# Patient Record
Sex: Male | Born: 1975 | Race: Black or African American | Hispanic: No | Smoking: Never smoker
Health system: Southern US, Community
[De-identification: ages and names within clinical notes are randomized; demographics above are authoritative.]

## PROBLEM LIST (undated history)

## (undated) DIAGNOSIS — K219 Gastro-esophageal reflux disease without esophagitis: Secondary | ICD-10-CM

## (undated) HISTORY — PX: NO PAST SURGERIES: SHX2092

## (undated) HISTORY — DX: Gastro-esophageal reflux disease without esophagitis: K21.9

---

## 2007-09-30 ENCOUNTER — Emergency Department (HOSPITAL_COMMUNITY): Admission: EM | Admit: 2007-09-30 | Discharge: 2007-09-30 | Payer: Self-pay | Admitting: Emergency Medicine

## 2009-03-28 ENCOUNTER — Ambulatory Visit: Payer: Self-pay | Admitting: Internal Medicine

## 2009-03-28 DIAGNOSIS — K219 Gastro-esophageal reflux disease without esophagitis: Secondary | ICD-10-CM

## 2009-05-30 ENCOUNTER — Ambulatory Visit: Payer: Self-pay | Admitting: Internal Medicine

## 2009-05-30 DIAGNOSIS — R1013 Epigastric pain: Secondary | ICD-10-CM

## 2009-05-30 LAB — CONVERTED CEMR LAB
ALT: 18 units/L (ref 0–53)
Basophils Relative: 0.2 % (ref 0.0–3.0)
Bilirubin Urine: NEGATIVE
Bilirubin, Direct: 0 mg/dL (ref 0.0–0.3)
Chloride: 105 meq/L (ref 96–112)
Eosinophils Relative: 1.3 % (ref 0.0–5.0)
HCT: 43.8 % (ref 39.0–52.0)
Hemoglobin, Urine: NEGATIVE
Hemoglobin: 14.7 g/dL (ref 13.0–17.0)
Lymphs Abs: 2 10*3/uL (ref 0.7–4.0)
MCV: 86.9 fL (ref 78.0–100.0)
Monocytes Absolute: 0.6 10*3/uL (ref 0.1–1.0)
Neutrophils Relative %: 71.5 % (ref 43.0–77.0)
Nitrite: NEGATIVE
Potassium: 3.7 meq/L (ref 3.5–5.1)
RBC: 5.03 M/uL (ref 4.22–5.81)
Sodium: 140 meq/L (ref 135–145)
Total Protein, Urine: NEGATIVE mg/dL
Total Protein: 7.8 g/dL (ref 6.0–8.3)
WBC: 9.5 10*3/uL (ref 4.5–10.5)

## 2009-10-01 ENCOUNTER — Telehealth: Payer: Self-pay | Admitting: Internal Medicine

## 2009-10-01 ENCOUNTER — Encounter: Payer: Self-pay | Admitting: Internal Medicine

## 2009-10-03 ENCOUNTER — Telehealth: Payer: Self-pay | Admitting: Internal Medicine

## 2010-10-22 NOTE — Progress Notes (Signed)
Summary: Pantoprzaole PA  Phone Note From Pharmacy   Caller: [facility] [phone] Call For: ID: [id number]  Summary of Call: Received PA request for Generic Protonix. I can try PA, but it will most likely be denied unless the patient has tried and failed both omeprazole and nexium. Please advise. Initial call taken by: Lucious Groves,  October 01, 2009 9:08 AM  Follow-up for Phone Call        try nexium, need a pharmacy Follow-up by: Etta Grandchild MD,  October 01, 2009 9:17 AM  Additional Follow-up for Phone Call Additional follow up Details #1::        sent prescription. Additional Follow-up by: Lucious Groves,  October 01, 2009 10:38 AM    New/Updated Medications: NEXIUM 40 MG CPDR (ESOMEPRAZOLE MAGNESIUM) One capsule by mouth once daily Prescriptions: NEXIUM 40 MG CPDR (ESOMEPRAZOLE MAGNESIUM) One capsule by mouth once daily  #30 x 11   Entered by:   Lucious Groves   Authorized by:   Etta Grandchild MD   Signed by:   Lucious Groves on 10/01/2009   Method used:   [facility]        [facility]  [id number]       [address]       [place], [place]  [address]       Ph: [phone] or [phone]       Fax: [phone]   RxID:   [id number] NEXIUM 40 MG CPDR (ESOMEPRAZOLE MAGNESIUM) One capsule by mouth once daily  #30 x 11   Entered and Authorized by:   Etta Grandchild MD   Signed by:   Etta Grandchild MD on 10/01/2009   Method used:   Historical   RxID:   [id number]   Appended Document: Pantoprzaole PA    Phone Note From Pharmacy   Summary of Call: Rec'd fax that nexium also requires PA. Sent in omeprazole for patient, insurance is apparently [place] make patient try omeprazole first. Initial call taken by: Lucious Groves,  October 01, 2009 12:55 PM  Follow-up for Phone Call        ok Follow-up by: Etta Grandchild MD,  October 01, 2009 1:10 PM    New/Updated Medications: OMEPRAZOLE 20 MG CPDR (OMEPRAZOLE) 1 by  mouth daily Prescriptions: OMEPRAZOLE 20 MG CPDR (OMEPRAZOLE) 1 by mouth daily  #30 x 11   Entered by:   Lucious Groves   Authorized by:   Etta Grandchild MD   Signed by:   Lucious Groves on 10/01/2009   Method used:   [facility]        [facility]  [address] [id number]       [address]       [place], [place]  [address]       Ph: [phone] or [phone]       Fax: [phone]   RxID:   [id number]

## 2010-10-22 NOTE — Progress Notes (Signed)
Summary: Omeprazole qty limit  Phone Note From Pharmacy   Call For: Case: [id number]  Summary of Call: Patient omeprazole requires PA due to qty limit only. Requested form. Initial call taken by: Lucious Groves,  October 01, 2009 1:28 PM  Follow-up for Phone Call        form awaiting signature. Follow-up by: Lucious Groves,  October 01, 2009 2:43 PM     Appended Document: Omeprazole qty limit form faxed.  Appended Document: Omeprazole qty limit Approved until 10/01/10

## 2010-10-22 NOTE — Progress Notes (Signed)
Summary: Temazepam request  Phone Note Refill Request Message from:  Fax from Pharmacy on October 03, 2009 10:52 AM  Temazepam 15mg  caps Sig: 1 by mouth at bedtime as needed #30 [facility] [phone] fax [phone]  Initial call taken by: Lucious Groves,  October 03, 2009 10:52 AM  Follow-up for Phone Call        ok Follow-up by: Etta Grandchild MD,  October 04, 2009 1:06 PM  Additional Follow-up for Phone Call Additional follow up Details #1::        phoned to pharmacy. Additional Follow-up by: Lucious Groves,  October 04, 2009 1:32 PM    New/Updated Medications: TEMAZEPAM 15 MG CAPS (TEMAZEPAM) 1 by mouth at bedtime as needed Prescriptions: TEMAZEPAM 15 MG CAPS (TEMAZEPAM) 1 by mouth at bedtime as needed  #30 x 3   Entered by:   Lucious Groves   Authorized by:   Etta Grandchild MD   Signed by:   Lucious Groves on 10/04/2009   Method used:   Telephoned to ...       [facility] ([facility]ail)       [address]       [place], [place]  [address]       Ph: [phone] or [phone]       Fax: [phone]   RxID:   [id number]

## 2010-10-22 NOTE — Medication Information (Signed)
Summary: Approved-Omeprazole/[employer]  Approved-Omeprazole/[employer]   Imported By: Sherian Rein 10/04/2009 11:34:39  _____________________________________________________________________  External Attachment:    Type:   Image     Comment:   External Document

## 2010-10-22 NOTE — Medication Information (Signed)
Summary: P Auth/Omeprazole/[employer]  P Auth/Omeprazole/[employer]   Imported By: Lester  Hills 10/03/2009 10:12:32  _____________________________________________________________________  External Attachment:    Type:   Image     Comment:   External Document

## 2012-04-01 ENCOUNTER — Encounter: Payer: Self-pay | Admitting: Gastroenterology

## 2012-04-13 ENCOUNTER — Encounter: Payer: Self-pay | Admitting: Gastroenterology

## 2012-04-13 ENCOUNTER — Ambulatory Visit (INDEPENDENT_AMBULATORY_CARE_PROVIDER_SITE_OTHER): Payer: BC Managed Care – PPO | Admitting: Gastroenterology

## 2012-04-13 VITALS — BP 140/90 | HR 64 | Ht 71.0 in | Wt 247.2 lb

## 2012-04-13 DIAGNOSIS — R1013 Epigastric pain: Secondary | ICD-10-CM

## 2012-04-13 MED ORDER — PANTOPRAZOLE SODIUM 40 MG PO TBEC: 40.0000 mg | DELAYED_RELEASE_TABLET | Freq: Every day | ORAL | Status: DC

## 2012-04-13 NOTE — Patient Instructions (Addendum)
You have been scheduled for an endoscopy with propofol. Please follow written instructions given to you at your visit today. If you use inhalers (even only as needed), please bring them with you on the day of your procedure.  We have sent the following medications to your pharmacy for you to pick up at your convenience:Pantoprazole.  cc: Sanda Linger, MD

## 2012-04-13 NOTE — Progress Notes (Signed)
History of Present Illness: This is a 36 year old male here today with his wife. He relates frequent problems with epigastric pain for the past several years. Occasionally his symptoms are improved by meals and occasionally worsened by meals. Symptoms have been inactive at times and other times they bothers him on a regular basis for months at that time. In 2010 he was treated for GERD with pantoprazole but he does not recall if this was effective in controlling symptoms. He is a Heritage manager at [employer] and has a very busy schedule over the next several months. Denies weight loss, constipation, diarrhea, change in stool caliber, melena, hematochezia, nausea, vomiting, dysphagia, reflux symptoms, chest pain.  Review of Systems: Pertinent positive and negative review of systems were noted in the above HPI section. All other review of systems were otherwise negative.  Current Medications, Allergies, Past Medical History, Past Surgical History, Family History and Social History were reviewed in [employer] record.  Physical Exam: General: Well developed , well nourished, no acute distress Head: Normocephalic and atraumatic Eyes:  sclerae anicteric, EOMI Ears: Normal auditory acuity Mouth: No deformity or lesions Neck: Supple, no masses or thyromegaly Lungs: Clear throughout to auscultation Heart: Regular rate and rhythm; no murmurs, rubs or bruits Abdomen: Soft,  mild tenderness over xiphoid process and epigastrium without rebound or guarding and non distended. No masses, hepatosplenomegaly or hernias noted. Normal Bowel sounds Musculoskeletal: Symmetrical with no gross deformities  Skin: No lesions on visible extremities Pulses:  Normal pulses noted Extremities: No clubbing, cyanosis, edema or deformities noted Neurological: Alert oriented x 4, grossly nonfocal Cervical Nodes:  No significant cervical adenopathy Inguinal Nodes: No significant inguinal  adenopathy Psychological:  Alert and cooperative. Normal mood and affect  Assessment and Recommendations:  1. Epigastric pain. Xiphoid area and epigastric tenderness. Rule out musculoskeletal pain, ulcer disease, GERD. Begin pantoprazole 40 mg daily and antireflux measures. Schedule upper endoscopy for further evaluation. The risks, benefits, and alternatives to endoscopy with possible biopsy and possible dilation were discussed with the patient and they consent to proceed. Consider abdominal ultrasound if upper endoscopy negative in symptoms not relieved with pantoprazole.

## 2012-04-20 ENCOUNTER — Encounter: Payer: BC Managed Care – PPO | Admitting: Gastroenterology

## 2012-04-28 ENCOUNTER — Ambulatory Visit: Payer: Self-pay | Admitting: Gastroenterology

## 2012-12-07 ENCOUNTER — Telehealth: Payer: Self-pay | Admitting: *Deleted

## 2012-12-07 NOTE — Telephone Encounter (Signed)
Via fax from [facility] patient needs prior authorization done for Pantoprazole.  I called [phone] and spoke with Tia.  Pantoprazole was approved, fax is being sent.  Case number is::: [id number]

## 2013-07-06 ENCOUNTER — Encounter (HOSPITAL_COMMUNITY): Payer: Self-pay | Admitting: Emergency Medicine

## 2013-07-06 ENCOUNTER — Emergency Department (HOSPITAL_COMMUNITY)
Admission: EM | Admit: 2013-07-06 | Discharge: 2013-07-06 | Disposition: A | Discharge status: Home / Self Care | Payer: BC Managed Care – PPO | Source: Home / Self Care

## 2013-07-06 DIAGNOSIS — S0181XA Laceration without foreign body of other part of head, initial encounter: Secondary | ICD-10-CM

## 2013-07-06 DIAGNOSIS — S0180XA Unspecified open wound of other part of head, initial encounter: Secondary | ICD-10-CM

## 2013-07-06 NOTE — ED Notes (Signed)
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

## 2013-07-06 NOTE — ED Provider Notes (Addendum)
CSN: [id number]     Arrival date & time 07/06/13  1936 History   None    Chief Complaint  Patient presents with  . Facial Laceration   (Consider location/radiation/quality/duration/timing/severity/associated sxs/prior Treatment) Patient is a 37 y.o. male presenting with skin laceration. The history is provided by the patient.  Laceration Location:  Face Facial laceration location:  Chin Length (cm):  4 Depth:  Cutaneous Quality: straight   Bleeding: controlled   Time since incident:  1 hour Laceration mechanism:  Blunt object (football helmet struck chin.) Pain details:    Severity:  Mild Foreign body present:  No foreign bodies Relieved by:  Nothing Worsened by:  Nothing tried Ineffective treatments:  None tried Tetanus status:  Up to date   Past Medical History  Diagnosis Date  . GERD (gastroesophageal reflux disease)    History reviewed. No pertinent past surgical history. Family History  Problem Relation Age of Onset  . Hypertension Mother    History  Substance Use Topics  . Smoking status: Never Smoker   . Smokeless tobacco: Never Used  . Alcohol Use: Yes    Review of Systems  Constitutional: Negative.   HENT: Negative for dental problem.   Musculoskeletal: Negative for neck pain.  Skin: Positive for wound.  Neurological: Negative for headaches.    Allergies  Review of patient's allergies indicates no known allergies.  Home Medications   Current Outpatient Rx  Name  Route  Sig  Dispense  Refill  . EXPIRED: pantoprazole (PROTONIX) 40 MG tablet   Oral   Take 1 tablet (40 mg total) by mouth daily.   30 tablet   11    There were no vitals taken for this visit. Physical Exam  Nursing note and vitals reviewed. Constitutional: He is oriented to person, place, and time. He appears well-developed and well-nourished.  HENT:  Head:    Neurological: He is alert and oriented to person, place, and time.  Skin: Skin is warm and dry.    ED Course    LACERATION REPAIR Date/Time: 07/06/2013 9:19 PM Performed by: Linna Hoff Authorized by: Bradd Canary D Consent: Verbal consent obtained. Risks and benefits: risks, benefits and alternatives were discussed Consent given by: patient Body area: head/neck Location details: chin Laceration length: 4 cm Foreign bodies: no foreign bodies Tendon involvement: none Nerve involvement: none Vascular damage: no Local anesthetic: lidocaine 2% with epinephrine Anesthetic total: 3 ml Patient sedated: no Preparation: Patient was prepped and draped in the usual sterile fashion. Irrigation solution: saline Amount of cleaning: standard Debridement: none Degree of undermining: none Skin closure: 5-0 Prolene Number of sutures: 8 Technique: running Approximation: close Approximation difficulty: simple Dressing: antibiotic ointment Patient tolerance: Patient tolerated the procedure well with no immediate complications.   (including critical care time) Labs Review Labs Reviewed - No data to display Imaging Review No results found.    MDM       Linna Hoff, MD 07/06/13 2120  Linna Hoff, MD 07/06/13 2122

## 2013-07-20 ENCOUNTER — Encounter (HOSPITAL_COMMUNITY): Payer: Self-pay | Admitting: Emergency Medicine

## 2013-07-20 ENCOUNTER — Emergency Department (INDEPENDENT_AMBULATORY_CARE_PROVIDER_SITE_OTHER)
Admission: EM | Admit: 2013-07-20 | Discharge: 2013-07-20 | Disposition: A | Discharge status: Home / Self Care | Payer: BC Managed Care – PPO | Source: Home / Self Care

## 2013-07-20 DIAGNOSIS — Z4802 Encounter for removal of sutures: Secondary | ICD-10-CM

## 2013-07-20 NOTE — ED Provider Notes (Signed)
CSN: [id number]     Arrival date & time 07/20/13  1959 History   None    Chief Complaint  Patient presents with  . Wound Check   (Consider location/radiation/quality/duration/timing/severity/associated sxs/prior Treatment) Patient is a 37 y.o. male presenting with wound check. The history is provided by the patient.  Wound Check This is a new problem. The current episode started more than 1 week ago (seen 10 /15 for lac on chin, here for suture removal., no problems.). The problem has been rapidly improving.    Past Medical History  Diagnosis Date  . GERD (gastroesophageal reflux disease)    History reviewed. No pertinent past surgical history. Family History  Problem Relation Age of Onset  . Hypertension Mother    History  Substance Use Topics  . Smoking status: Never Smoker   . Smokeless tobacco: Never Used  . Alcohol Use: Yes    Review of Systems  Constitutional: Negative.   Skin: Positive for wound.    Allergies  Review of patient's allergies indicates no known allergies.  Home Medications   Current Outpatient Rx  Name  Route  Sig  Dispense  Refill  . EXPIRED: pantoprazole (PROTONIX) 40 MG tablet   Oral   Take 1 tablet (40 mg total) by mouth daily.   30 tablet   11    BP 156/98  Pulse 62  Temp(Src) 98.4 F (36.9 C) (Oral)  Resp 17  SpO2 99% Physical Exam  Nursing note and vitals reviewed. Constitutional: He is oriented to person, place, and time. He appears well-developed and well-nourished.  Neurological: He is alert and oriented to person, place, and time.  Skin: Skin is warm and dry.  Chin lac well healed, sutures removed.    ED Course  Procedures (including critical care time) Labs Review Labs Reviewed - No data to display Imaging Review No results found.    MDM      Linna Hoff, MD 07/20/13 2034

## 2013-07-20 NOTE — ED Notes (Signed)
Pt. Stated, suture removal, no redness or swelling

## 2014-03-27 ENCOUNTER — Encounter (HOSPITAL_COMMUNITY): Payer: Self-pay | Admitting: Emergency Medicine

## 2014-03-27 ENCOUNTER — Emergency Department (INDEPENDENT_AMBULATORY_CARE_PROVIDER_SITE_OTHER): Payer: BC Managed Care – PPO

## 2014-03-27 ENCOUNTER — Emergency Department (HOSPITAL_COMMUNITY)
Admission: EM | Admit: 2014-03-27 | Discharge: 2014-03-27 | Disposition: A | Discharge status: Home / Self Care | Payer: BC Managed Care – PPO | Source: Home / Self Care | Attending: Emergency Medicine | Admitting: Emergency Medicine

## 2014-03-27 DIAGNOSIS — R509 Fever, unspecified: Secondary | ICD-10-CM

## 2014-03-27 LAB — CBC WITH DIFFERENTIAL/PLATELET
BASOS ABS: 0 10*3/uL (ref 0.0–0.1)
BASOS PCT: 0 % (ref 0–1)
EOS ABS: 0 10*3/uL (ref 0.0–0.7)
EOS PCT: 1 % (ref 0–5)
HEMATOCRIT: 43.3 % (ref 39.0–52.0)
Hemoglobin: 14.6 g/dL (ref 13.0–17.0)
Lymphocytes Relative: 14 % (ref 12–46)
Lymphs Abs: 0.5 10*3/uL — ABNORMAL LOW (ref 0.7–4.0)
MCH: 29 pg (ref 26.0–34.0)
MCHC: 33.7 g/dL (ref 30.0–36.0)
MCV: 85.9 fL (ref 78.0–100.0)
MONO ABS: 0.3 10*3/uL (ref 0.1–1.0)
Monocytes Relative: 9 % (ref 3–12)
Neutro Abs: 2.7 10*3/uL (ref 1.7–7.7)
Neutrophils Relative %: 76 % (ref 43–77)
PLATELETS: 153 10*3/uL (ref 150–400)
RBC: 5.04 MIL/uL (ref 4.22–5.81)
RDW: 13.5 % (ref 11.5–15.5)
WBC: 3.6 10*3/uL — ABNORMAL LOW (ref 4.0–10.5)

## 2014-03-27 LAB — POCT INFECTIOUS MONO SCREEN: Mono Screen: NEGATIVE

## 2014-03-27 LAB — COMPREHENSIVE METABOLIC PANEL
ALBUMIN: 4.1 g/dL (ref 3.5–5.2)
ALT: 31 U/L (ref 0–53)
AST: 29 U/L (ref 0–37)
Alkaline Phosphatase: 62 U/L (ref 39–117)
Anion gap: 16 — ABNORMAL HIGH (ref 5–15)
BUN: 12 mg/dL (ref 6–23)
CALCIUM: 8.8 mg/dL (ref 8.4–10.5)
CO2: 23 mEq/L (ref 19–32)
CREATININE: 1.61 mg/dL — AB (ref 0.50–1.35)
Chloride: 99 mEq/L (ref 96–112)
GFR calc Af Amer: 62 mL/min — ABNORMAL LOW (ref >90)
GFR, EST NON AFRICAN AMERICAN: 54 mL/min — AB (ref >90)
Glucose, Bld: 104 mg/dL — ABNORMAL HIGH (ref 70–99)
Potassium: 3.9 mEq/L (ref 3.7–5.3)
SODIUM: 138 meq/L (ref 137–147)
TOTAL PROTEIN: 8 g/dL (ref 6.0–8.3)
Total Bilirubin: 0.3 mg/dL (ref 0.3–1.2)

## 2014-03-27 LAB — POCT URINALYSIS DIP (DEVICE)
Glucose, UA: NEGATIVE mg/dL
LEUKOCYTES UA: NEGATIVE
NITRITE: NEGATIVE
PH: 5.5 (ref 5.0–8.0)
PROTEIN: 100 mg/dL — AB
Specific Gravity, Urine: 1.025 (ref 1.005–1.030)
Urobilinogen, UA: 0.2 mg/dL (ref 0.0–1.0)

## 2014-03-27 LAB — POCT RAPID STREP A: Streptococcus, Group A Screen (Direct): NEGATIVE

## 2014-03-27 MED ORDER — DOXYCYCLINE HYCLATE 100 MG PO TABS: 100.0000 mg | ORAL_TABLET | Freq: Two times a day (BID) | ORAL | Status: DC

## 2014-03-27 MED ORDER — HYDROCODONE-ACETAMINOPHEN 5-325 MG PO TABS: ORAL_TABLET | ORAL | Status: DC

## 2014-03-27 MED ORDER — ACETAMINOPHEN 325 MG PO TABS
650.0000 mg | ORAL_TABLET | Freq: Once | ORAL | Status: AC
  Administered 2014-03-27: 650 mg via ORAL

## 2014-03-27 MED ORDER — ACETAMINOPHEN 325 MG PO TABS
ORAL_TABLET | ORAL | Status: AC
  Filled 2014-03-27: qty 3

## 2014-03-27 NOTE — Discharge Instructions (Signed)

## 2014-03-27 NOTE — ED Notes (Signed)
C/o fever.  N/v.  Body aches.  Cold chills.  Rash on both arms without irritation.  Symptoms started Friday night.  Denies diarrhea.

## 2014-03-27 NOTE — ED Provider Notes (Signed)
Chief Complaint   Chief Complaint  Patient presents with  . Fever  . Rash    History of Present Illness   Terry [name] is a 38 year old male who has had a four-day history of chills, myalgias, subjective fever, and a nonpruritic on his arms, anorexia, nausea, vomiting, diarrhea, left ear pain, nasal congestion, rhinorrhea, and sore throat. He denies any headache, stiff neck, adenopathy, cough, chest pain, shortness of breath, abdominal pain, or blood in the stool. He's had no urinary symptoms. No history of a tick bite. No foreign travel or animal exposure. No sick exposures.  Review of Systems   Other than as noted above, the patient denies any of the following symptoms. Systemic:  No sweats, fatigue, myalgias, headache, weight loss or anorexia. Eye:  No redness or drainage. ENT:  No earache, nasal congestion, rhinorrhea, sinus pressure, or sore throat. No adenopathy or stiff neck. Lungs:  No cough, sputum production, wheezing, shortness of breath.  Cardiovascular:  No chest pain. GI:  No nausea, vomiting, abdominal pain or diarrhea. GU:  No dysuria, frequency, or hematuria. Skin:  No rash.  PMFSH   Past medical history, family history, social history, meds, and allergies were reviewed.   Physical Examination     Vital signs:  BP 125/75  Pulse 96  Temp(Src) 103.1 F (39.5 C) (Oral)  Resp 18  SpO2 97% General:  Alert, in no distress. Eye:  PERRL, full EOMs.  Lids and conjunctivas were normal. ENT:  TMs and canals were normal, without erythema or inflammation.  Nasal mucosa was clear and uncongested, without drainage.  Mucous membranes were moist.  Pharynx was clear, without exudate or drainage.  There were no oral ulcerations or lesions. Neck:  Supple, no adenopathy, tenderness or mass. Thyroid was normal. Lungs:  No respiratory distress.  Lungs were clear to auscultation, without wheezes, rales or rhonchi.  Breath sounds were clear and equal bilaterally. Heart:   Regular rhythm, without gallops, murmers or rubs. Abdomen:  Soft, flat, and non-tender to palpation.  No hepatosplenomagaly or mass. Extremities:  No swelling, erythema, or joint pain to palpation. Skin:  Clear, warm, and dry, there was an erythematous maculopapular rash on both volar forearms, skin was otherwise clear.      Labs   Results for orders placed during the hospital encounter of 03/27/14  CBC WITH DIFFERENTIAL      Result Value Ref Range   WBC 3.6 (*) 4.0 - 10.5 K/uL   RBC 5.04  4.22 - 5.81 MIL/uL   Hemoglobin 14.6  13.0 - 17.0 g/dL   HCT 95.643.3  21.339.0 - 08.652.0 %   MCV 85.9  78.0 - 100.0 fL   MCH 29.0  26.0 - 34.0 pg   MCHC 33.7  30.0 - 36.0 g/dL   RDW 57.813.5  46.911.5 - 62.915.5 %   Platelets 153  150 - 400 K/uL   Neutrophils Relative % 76  43 - 77 %   Neutro Abs 2.7  1.7 - 7.7 K/uL   Lymphocytes Relative 14  12 - 46 %   Lymphs Abs 0.5 (*) 0.7 - 4.0 K/uL   Monocytes Relative 9  3 - 12 %   Monocytes Absolute 0.3  0.1 - 1.0 K/uL   Eosinophils Relative 1  0 - 5 %   Eosinophils Absolute 0.0  0.0 - 0.7 K/uL   Basophils Relative 0  0 - 1 %   Basophils Absolute 0.0  0.0 - 0.1 K/uL  COMPREHENSIVE METABOLIC PANEL  Result Value Ref Range   Sodium 138  137 - 147 mEq/L   Potassium 3.9  3.7 - 5.3 mEq/L   Chloride 99  96 - 112 mEq/L   CO2 23  19 - 32 mEq/L   Glucose, Bld 104 (*) 70 - 99 mg/dL   BUN 12  6 - 23 mg/dL   Creatinine, Ser 1.61 (*) 0.50 - 1.35 mg/dL   Calcium 8.8  8.4 - 09.6 mg/dL   Total Protein 8.0  6.0 - 8.3 g/dL   Albumin 4.1  3.5 - 5.2 g/dL   AST 29  0 - 37 U/L   ALT 31  0 - 53 U/L   Alkaline Phosphatase 62  39 - 117 U/L   Total Bilirubin 0.3  0.3 - 1.2 mg/dL   GFR calc non Af Amer 54 (*) >90 mL/min   GFR calc Af Amer 62 (*) >90 mL/min   Anion gap 16 (*) 5 - 15  POCT RAPID STREP A ([facility]ARE ONLY)      Result Value Ref Range   Streptococcus, Group A Screen (Direct) NEGATIVE  NEGATIVE  POCT INFECTIOUS MONO SCREEN      Result Value Ref Range   Mono Screen  NEGATIVE  NEGATIVE  POCT URINALYSIS DIP (DEVICE)      Result Value Ref Range   Glucose, UA NEGATIVE  NEGATIVE mg/dL   Bilirubin Urine SMALL (*) NEGATIVE   Ketones, ur TRACE (*) NEGATIVE mg/dL   Specific Gravity, Urine 1.025  1.005 - 1.030   Hgb urine dipstick TRACE (*) NEGATIVE   pH 5.5  5.0 - 8.0   Protein, ur 100 (*) NEGATIVE mg/dL   Urobilinogen, UA 0.2  0.0 - 1.0 mg/dL   Nitrite NEGATIVE  NEGATIVE   Leukocytes, UA NEGATIVE  NEGATIVE     Radiology   Dg Chest 2 View  03/27/2014   CLINICAL DATA:  Cough, fever, congestion and rash.  EXAM: CHEST - 2 VIEW  COMPARISON:  09/30/2007  FINDINGS: The heart size and mediastinal contours are within normal limits. There is no evidence of pulmonary edema, consolidation, pneumothorax, nodule or pleural fluid. The visualized skeletal structures are unremarkable.  IMPRESSION: No active disease.   Electronically Signed   By: Irish Lack M.D.   On: 03/27/2014 14:20   Assessment   The encounter diagnosis was FUO (fever of unknown origin).  Differential diagnosis includes [facility] spotted fever, viral syndrome, strep throat, or infectious mononucleosis.  Plan   1.  Meds:  The following meds were prescribed:   Discharge Medication List as of 03/27/2014  3:35 PM    START taking these medications   Details  doxycycline (VIBRA-TABS) 100 MG tablet Take 1 tablet (100 mg total) by mouth 2 (two) times daily., Starting 03/27/2014, Until Discontinued, Normal    HYDROcodone-acetaminophen (NORCO/VICODIN) 5-325 MG per tablet 1 to 2 tabs every 4 to 6 hours as needed for pain., Print        2.  Patient Education/Counseling:  The patient was given appropriate handouts, self care instructions, and instructed in symptomatic relief.  May use Tylenol or ibuprofen for fever.   3.  Follow up:  The patient was told to follow up here in 2 to 3 days, or sooner if becoming worse in any way, and given some red flag symptoms such as increasing fever, severe headache  or stiff neck, difficulty breathing, chest pain, abdominal pain, or persistent vomiting which would prompt immediate return.  Follow up here as  necessary.     Reuben Likesavid C Nakeem Murnane, MD 03/27/14 2223

## 2014-03-28 LAB — URINE CULTURE
COLONY COUNT: NO GROWTH
Culture: NO GROWTH

## 2014-03-28 LAB — HIV ANTIBODY (ROUTINE TESTING W REFLEX): HIV: NONREACTIVE

## 2014-03-28 LAB — ROCKY MTN SPOTTED FVR AB, IGM-BLOOD: RMSF IGM: 0.25 IV (ref 0.00–0.89)

## 2014-03-28 LAB — RPR

## 2014-03-28 NOTE — Progress Notes (Signed)
Quick Note:  Result is abnormal as noted. We will inform patient about abnormal result. The patient was called and informed of the normal results and the one abnormal result of elevated creatinine. He was advised to make sure his blood pressure was impeccably controlled with a systolic target of less than 140 and diastolic target of less than 90. I also recommended that he get his cholesterol checked, drink extra fluids, and avoid kidney toxins such as nonsteroidal anti-inflammatory drugs and IV contrast. The patient states he's feeling a lot better today. His rash has not gotten any worse. Temperature is down to 99. He's feeling a lot better. I told him as long as his was feeling okay he did not need to return tomorrow. I suggest he does finish up the doxycycline. ______

## 2014-03-29 LAB — CULTURE, GROUP A STREP

## 2014-06-05 ENCOUNTER — Telehealth: Payer: Self-pay | Admitting: Gastroenterology

## 2014-06-05 NOTE — Telephone Encounter (Signed)
Spoke with the patient. He reports symptoms of nausea and his stomach hurts. Admits he has been under a lot of pressure and it could be stress related. He has been off Pantoprazole for a "long time." Appointment set for the patient to be evaluated.

## 2014-06-08 ENCOUNTER — Ambulatory Visit (INDEPENDENT_AMBULATORY_CARE_PROVIDER_SITE_OTHER): Payer: BC Managed Care – PPO | Admitting: Nurse Practitioner

## 2014-06-08 ENCOUNTER — Other Ambulatory Visit (INDEPENDENT_AMBULATORY_CARE_PROVIDER_SITE_OTHER): Payer: BC Managed Care – PPO

## 2014-06-08 ENCOUNTER — Encounter: Payer: Self-pay | Admitting: Nurse Practitioner

## 2014-06-08 VITALS — BP 130/98 | HR 72 | Ht 70.0 in | Wt 237.1 lb

## 2014-06-08 DIAGNOSIS — R1013 Epigastric pain: Secondary | ICD-10-CM

## 2014-06-08 LAB — CBC WITH DIFFERENTIAL/PLATELET
BASOS ABS: 0 10*3/uL (ref 0.0–0.1)
Basophils Relative: 0.4 % (ref 0.0–3.0)
Eosinophils Absolute: 0.2 10*3/uL (ref 0.0–0.7)
Eosinophils Relative: 3.3 % (ref 0.0–5.0)
HCT: 40.5 % (ref 39.0–52.0)
HEMOGLOBIN: 13.5 g/dL (ref 13.0–17.0)
LYMPHS PCT: 34 % (ref 12.0–46.0)
Lymphs Abs: 2.2 10*3/uL (ref 0.7–4.0)
MCHC: 33.2 g/dL (ref 30.0–36.0)
MCV: 86.2 fl (ref 78.0–100.0)
MONOS PCT: 5 % (ref 3.0–12.0)
Monocytes Absolute: 0.3 10*3/uL (ref 0.1–1.0)
Neutro Abs: 3.8 10*3/uL (ref 1.4–7.7)
Neutrophils Relative %: 57.3 % (ref 43.0–77.0)
PLATELETS: 288 10*3/uL (ref 150.0–400.0)
RBC: 4.7 Mil/uL (ref 4.22–5.81)
RDW: 13.9 % (ref 11.5–15.5)
WBC: 6.6 10*3/uL (ref 4.0–10.5)

## 2014-06-08 MED ORDER — PANTOPRAZOLE SODIUM 40 MG PO TBEC: DELAYED_RELEASE_TABLET | ORAL | Status: DC

## 2014-06-08 NOTE — Patient Instructions (Signed)
Please go to the basement level to have your labs drawn.   We sent a prescription for 30 days of Pantoprazole Sodium 40 mg ( Protonix),.  Take 1 tab 30 min before breakfast for 20 days, then take 1 tab every other day until finished.  Call us if you don't get better or symptoms come back.  [phone]

## 2014-06-09 NOTE — Progress Notes (Signed)
Reviewed and agree with management plan.  Ivis Henneman T. Aerielle Stoklosa, MD FACG 

## 2014-06-09 NOTE — Progress Notes (Signed)
     History of Present Illness:   Patient is a 38 year old male evaluated by [place] in July 2013 for epigastric pain. He is back today for evaluation of same. Patient took a PPI for a month or so following his last visit here and the epigastric pain resolved. He did well until a few weeks ago. Now with almost daily epigastric burning radiating through to back. He has mild nausea at times. Patient wonders if pain may be stress related though it bothers him at home, not just work. Burning has disrupted his sleep. Patient rarely takes NSAIDs. No overt GI bleeding. No black stools. BMs normal. Weight is stable.   Current Medications, Allergies, Past Medical History, Past Surgical History, Family History and Social History were reviewed in [employer] record.  Physical Exam: General: Pleasant, well developed , black male in no acute distress Head: Normocephalic and atraumatic Eyes:  sclerae anicteric, conjunctiva pink  Ears: Normal auditory acuity Lungs: Clear throughout to auscultation Heart: Regular rate and rhythm Abdomen: Soft, non distended, mild epigastric tenderness. No masses, no hepatomegaly. Normal bowel sounds Musculoskeletal: Symmetrical with no gross deformities  Extremities: No edema  Neurological: Alert oriented x 4, grossly nonfocal Psychological:  Alert and cooperative. Normal mood and affect  Assessment and Recommendations:   38 year old male with acute on chronic epigastric burning / occasional nausea. Patient thinks symptoms could be stress related. Suspect non-ulcer dyspepsia. These same symptoms resolved on PPI a couple of years ago. Patient has no alarming symptoms. It is reasonable to retreat with a PPI . Advised Protonix  daily for 20 days followed by one QOD until 30 day supple complete. If symptoms don't improve paitent will need EGD for further evaluation.

## 2015-01-10 ENCOUNTER — Ambulatory Visit (INDEPENDENT_AMBULATORY_CARE_PROVIDER_SITE_OTHER): Payer: BC Managed Care – PPO | Admitting: Internal Medicine

## 2015-01-10 ENCOUNTER — Encounter: Payer: Self-pay | Admitting: Internal Medicine

## 2015-01-10 VITALS — BP 120/80 | HR 65 | Temp 97.8°F | Resp 16 | Ht 70.0 in | Wt 246.0 lb

## 2015-01-10 DIAGNOSIS — T798XXA Other early complications of trauma, initial encounter: Secondary | ICD-10-CM

## 2015-01-10 DIAGNOSIS — L089 Local infection of the skin and subcutaneous tissue, unspecified: Secondary | ICD-10-CM | POA: Insufficient documentation

## 2015-01-10 DIAGNOSIS — T148XXA Other injury of unspecified body region, initial encounter: Secondary | ICD-10-CM

## 2015-01-10 MED ORDER — DOXYCYCLINE HYCLATE 100 MG PO TBEC: 100.0000 mg | DELAYED_RELEASE_TABLET | Freq: Two times a day (BID) | ORAL | Status: AC

## 2015-01-10 NOTE — Progress Notes (Signed)
Pre visit review using our clinic review tool, if applicable. No additional management support is needed unless otherwise documented below in the visit note. 

## 2015-01-10 NOTE — Patient Instructions (Signed)
Wound Infection °A wound infection happens when a type of germ (bacteria) starts growing in the wound. In some cases, this can cause the wound to break open. If cared for properly, the infected wound will heal from the inside to the outside. Wound infections need treatment. °CAUSES °An infection is caused by bacteria growing in the wound.  °SYMPTOMS  °· Increase in redness, swelling, or pain at the wound site. °· Increase in drainage at the wound site. °· Wound or bandage (dressing) starts to smell bad. °· Fever. °· Feeling tired or fatigued. °· Pus draining from the wound. °TREATMENT  °Your health care provider will prescribe antibiotic medicine. The wound infection should improve within 24 to 48 hours. Any redness around the wound should stop spreading and the wound should be less painful.  °HOME CARE INSTRUCTIONS  °· Only take over-the-counter or prescription medicines for pain, discomfort, or fever as directed by your health care provider. °· Take your antibiotics as directed. Finish them even if you start to feel better. °· Gently wash the area with mild soap and water 2 times a day, or as directed. Rinse off the soap. Pat the area dry with a clean towel. Do not rub the wound. This may cause bleeding. °· Follow your health care provider's instructions for how often you need to change the dressing. °· Apply ointment and a dressing to the wound as directed. °· If the dressing sticks, moisten it with soapy water and gently remove it. °· Change the bandage right away if it becomes wet, dirty, or develops a bad smell. °· Take showers. Do not take tub baths, swim, or do anything that may soak the wound until it is healed. °· Avoid exercises that make you sweat heavily. °· Use anti-itch medicine as directed by your health care provider. The wound may itch when it is healing. Do not pick or scratch at the wound. °· Follow up with your health care provider to get your wound rechecked as directed. °SEEK MEDICAL CARE  IF: °· You have an increase in swelling, pain, or redness around the wound. °· You have an increase in the amount of pus coming from the wound. °· There is a bad smell coming from the wound. °· More of the wound breaks open. °· You have a fever. °MAKE SURE YOU:  °· Understand these instructions. °· Will watch your condition. °· Will get help right away if you are not doing well or get worse. °Document Released: 06/07/2003 Document Revised: 09/13/2013 Document Reviewed: 01/12/2011 °[employer]® Patient Information ©2015 [employer]. This information is not intended to replace advice given to you by your health care provider. Make sure you discuss any questions you have with your health care provider. ° °

## 2015-01-10 NOTE — Assessment & Plan Note (Signed)
Will treat with doxycycline as there may be a mild staph or strep infection He was given pt ed material about wound care

## 2015-01-10 NOTE — Progress Notes (Signed)
   Subjective:    Patient ID: Terry [name], male    DOB: [birth year], [age] y.o.   MRN: [id number]  HPI Comments: He was trimming his pubic hair about one week ago and cut the right shaft of his penis and came in today for a wound check - he has put OTC topical antibiotic on it.  Wound Check He was originally treated 5 to 10 days ago. Previous treatment included wound cleansing or irrigation. There has been no drainage from the wound. There is no redness present. There is no swelling present. The pain has improved. He has no difficulty moving the affected extremity or digit.      Review of Systems  Constitutional: Negative.  Negative for fever, chills, diaphoresis, appetite change and fatigue.  HENT: Negative.   Eyes: Negative.   Respiratory: Negative.   Cardiovascular: Negative.  Negative for chest pain, palpitations and leg swelling.  Gastrointestinal: Negative.   Endocrine: Negative.   Genitourinary: Negative.  Negative for difficulty urinating.  Musculoskeletal: Negative.   Skin: Positive for wound. Negative for rash.  Allergic/Immunologic: Negative.   Hematological: Negative.  Negative for adenopathy. Does not bruise/bleed easily.  Psychiatric/Behavioral: Negative.        Objective:   Physical Exam  Constitutional: He is oriented to person, place, and time. He appears well-developed. No distress.  HENT:  Mouth/Throat: No oropharyngeal exudate.  Eyes: Right eye exhibits no discharge. Left eye exhibits no discharge. No scleral icterus.  Neck: Normal range of motion. Neck supple. No JVD present. No tracheal deviation present. No thyromegaly present.  Cardiovascular: Normal rate, regular rhythm, normal heart sounds and intact distal pulses.  Exam reveals no gallop and no friction rub.   No murmur heard. Pulmonary/Chest: Effort normal and breath sounds normal. No stridor. No respiratory distress. He has no wheezes. He has no rales. He exhibits no tenderness.  Abdominal: Soft.  Bowel sounds are normal. He exhibits no distension and no mass. There is no tenderness. There is no rebound and no guarding. Hernia confirmed negative in the right inguinal area and confirmed negative in the left inguinal area.  Genitourinary: Testes normal.    Right testis shows no mass, no swelling and no tenderness. Right testis is descended. Left testis shows no mass, no swelling and no tenderness. Left testis is descended. Circumcised. Penile erythema present. No penile tenderness. No discharge found.  Musculoskeletal: Normal range of motion. He exhibits no edema or tenderness.  Lymphadenopathy:    He has no cervical adenopathy.       Right: No inguinal adenopathy present.       Left: No inguinal adenopathy present.  Neurological: He is oriented to person, place, and time.  Skin: Skin is warm and dry. No rash noted. He is not diaphoretic. No erythema. No pallor.  Vitals reviewed.         Assessment & Plan:

## 2018-02-11 ENCOUNTER — Encounter: Payer: Self-pay | Admitting: Internal Medicine

## 2018-02-11 ENCOUNTER — Other Ambulatory Visit (INDEPENDENT_AMBULATORY_CARE_PROVIDER_SITE_OTHER): Payer: BC Managed Care – PPO

## 2018-02-11 ENCOUNTER — Ambulatory Visit (INDEPENDENT_AMBULATORY_CARE_PROVIDER_SITE_OTHER): Payer: BC Managed Care – PPO | Admitting: Internal Medicine

## 2018-02-11 VITALS — BP 180/100 | HR 65 | Temp 97.9°F | Resp 16 | Ht 71.0 in | Wt 257.0 lb

## 2018-02-11 DIAGNOSIS — I10 Essential (primary) hypertension: Secondary | ICD-10-CM | POA: Diagnosis not present

## 2018-02-11 DIAGNOSIS — Z0000 Encounter for general adult medical examination without abnormal findings: Secondary | ICD-10-CM | POA: Diagnosis not present

## 2018-02-11 DIAGNOSIS — E785 Hyperlipidemia, unspecified: Secondary | ICD-10-CM | POA: Diagnosis not present

## 2018-02-11 DIAGNOSIS — E559 Vitamin D deficiency, unspecified: Secondary | ICD-10-CM | POA: Diagnosis not present

## 2018-02-11 LAB — LIPID PANEL
CHOL/HDL RATIO: 5
Cholesterol: 201 mg/dL — ABNORMAL HIGH (ref 0–200)
HDL: 39.1 mg/dL (ref >39.00)
LDL CALC: 142 mg/dL — AB (ref 0–99)
NONHDL: 161.83
TRIGLYCERIDES: 101 mg/dL (ref 0.0–149.0)
VLDL: 20.2 mg/dL (ref 0.0–40.0)

## 2018-02-11 LAB — URINALYSIS, ROUTINE W REFLEX MICROSCOPIC
Bilirubin Urine: NEGATIVE
Ketones, ur: NEGATIVE
Leukocytes, UA: NEGATIVE
Nitrite: NEGATIVE
PH: 6 (ref 5.0–8.0)
Specific Gravity, Urine: >=1.03 — AB (ref 1.000–1.030)
TOTAL PROTEIN, URINE-UPE24: NEGATIVE
UROBILINOGEN UA: 0.2 (ref 0.0–1.0)
Urine Glucose: NEGATIVE

## 2018-02-11 LAB — COMPREHENSIVE METABOLIC PANEL
ALT: 17 U/L (ref 0–53)
AST: 17 U/L (ref 0–37)
Albumin: 4.5 g/dL (ref 3.5–5.2)
Alkaline Phosphatase: 85 U/L (ref 39–117)
BUN: 14 mg/dL (ref 6–23)
CO2: 28 meq/L (ref 19–32)
Calcium: 9.4 mg/dL (ref 8.4–10.5)
Chloride: 104 mEq/L (ref 96–112)
Creatinine, Ser: 1.24 mg/dL (ref 0.40–1.50)
GFR: 82.99 mL/min (ref >60.00)
GLUCOSE: 97 mg/dL (ref 70–99)
POTASSIUM: 3.9 meq/L (ref 3.5–5.1)
SODIUM: 139 meq/L (ref 135–145)
Total Bilirubin: 0.5 mg/dL (ref 0.2–1.2)
Total Protein: 7.7 g/dL (ref 6.0–8.3)

## 2018-02-11 LAB — CBC WITH DIFFERENTIAL/PLATELET
BASOS ABS: 0.1 10*3/uL (ref 0.0–0.1)
Basophils Relative: 0.7 % (ref 0.0–3.0)
Eosinophils Absolute: 0.2 10*3/uL (ref 0.0–0.7)
Eosinophils Relative: 2.2 % (ref 0.0–5.0)
HCT: 41.7 % (ref 39.0–52.0)
Hemoglobin: 13.9 g/dL (ref 13.0–17.0)
Lymphocytes Relative: 41 % (ref 12.0–46.0)
Lymphs Abs: 3.4 10*3/uL (ref 0.7–4.0)
MCHC: 33.3 g/dL (ref 30.0–36.0)
MCV: 84.8 fl (ref 78.0–100.0)
MONO ABS: 0.4 10*3/uL (ref 0.1–1.0)
MONOS PCT: 5.1 % (ref 3.0–12.0)
NEUTROS ABS: 4.2 10*3/uL (ref 1.4–7.7)
NEUTROS PCT: 51 % (ref 43.0–77.0)
PLATELETS: 257 10*3/uL (ref 150.0–400.0)
RBC: 4.92 Mil/uL (ref 4.22–5.81)
RDW: 13.9 % (ref 11.5–15.5)
WBC: 8.2 10*3/uL (ref 4.0–10.5)

## 2018-02-11 LAB — VITAMIN D 25 HYDROXY (VIT D DEFICIENCY, FRACTURES): VITD: 15.24 ng/mL — AB (ref 30.00–100.00)

## 2018-02-11 LAB — PSA: PSA: 0.52 ng/mL (ref 0.10–4.00)

## 2018-02-11 MED ORDER — AZILSARTAN-CHLORTHALIDONE 40-25 MG PO TABS: 1.0000 | ORAL_TABLET | Freq: Every day | ORAL | 0 refills | Status: DC

## 2018-02-11 MED ORDER — CHOLECALCIFEROL 50 MCG (2000 UT) PO TABS: 2.0000 | ORAL_TABLET | Freq: Every day | ORAL | 1 refills | Status: AC

## 2018-02-11 NOTE — Patient Instructions (Signed)

## 2018-02-11 NOTE — Progress Notes (Signed)
Subjective:  Patient ID: Terry [name], male    DOB: 30-May-1976  Age: 42 y.o. MRN: [id number]  CC: Annual Exam and Hypertension   HPI Terry [name] presents for a CPX.  He complains of a few headaches and wt gain recently.  He is taking a decongestant for nasal congestion.  He denies any recent episodes of CP, DOE, palpitations, edema, or fatigue.  He has a strong family history of hypertension.  Outpatient Medications Prior to Visit  Medication Sig Dispense Refill  . pantoprazole (PROTONIX) 40 MG tablet Take 1 tab 30 min before breakfast. Take for 20 days, then take 1 tab every other day until finished. (Patient not taking: Reported on 02/11/2018) 30 tablet 0   No facility-administered medications prior to visit.     ROS Review of Systems  Constitutional: Positive for unexpected weight change. Negative for appetite change, diaphoresis and fatigue.  HENT: Positive for congestion. Negative for nosebleeds, postnasal drip, rhinorrhea, sinus pressure and sore throat.   Eyes: Negative for pain and visual disturbance.  Respiratory: Negative for apnea, cough, chest tightness, shortness of breath and wheezing.   Cardiovascular: Negative for chest pain, palpitations and leg swelling.  Gastrointestinal: Negative for abdominal pain, constipation, diarrhea, nausea and vomiting.  Endocrine: Negative.   Genitourinary: Negative for difficulty urinating, dysuria, penile swelling, scrotal swelling, testicular pain and urgency.  Musculoskeletal: Negative.  Negative for back pain and myalgias.  Skin: Negative.  Negative for color change, pallor and rash.  Neurological: Positive for headaches. Negative for dizziness, tremors, weakness, light-headedness and numbness.  Hematological: Negative for adenopathy. Does not bruise/bleed easily.  Psychiatric/Behavioral: Negative.     Objective:  BP (!) 180/100 (BP Location: Left Arm, Patient Position: Sitting, Cuff Size: Large) Comment: BP (R)  180/100 (L) 182/106  Pulse 65   Temp 97.9 F (36.6 C) (Oral)   Resp 16   Ht  (1.803 m)   Wt 257 lb (116.6 kg)   SpO2 96%   BMI 35.84 kg/m   BP Readings from Last 3 Encounters:  02/11/18 (!) 180/100  01/10/15 120/80  06/08/14 (!) 130/98    Wt Readings from Last 3 Encounters:  02/11/18 257 lb (116.6 kg)  01/10/15 246 lb (111.6 kg)  06/08/14 237 lb 2 oz (107.6 kg)    Physical Exam  Constitutional: He is oriented to person, place, and time. No distress.  HENT:  Mouth/Throat: Oropharynx is clear and moist. No oropharyngeal exudate.  Eyes: Pupils are equal, round, and reactive to light. Conjunctivae and EOM are normal.  Neck: Normal range of motion. Neck supple. No thyromegaly present.  Cardiovascular: Normal rate, regular rhythm, normal heart sounds and intact distal pulses.  No murmur heard. EKG ----   Sinus  Rhythm  -First degree A-V block  PRi = 246 -consider old anterior infarct.   ABNORMAL - no old EKG for comparison, anterior changes are not sginificant   Pulmonary/Chest: Effort normal and breath sounds normal. He has no wheezes. He has no rales.  Abdominal: Soft. Bowel sounds are normal. He exhibits no mass. There is no hepatosplenomegaly. There is no tenderness. Hernia confirmed negative in the right inguinal area and confirmed negative in the left inguinal area.  Genitourinary: Rectum normal, prostate normal, testes normal and penis normal. Rectal exam shows no external hemorrhoid, no internal hemorrhoid, no fissure, no mass, no tenderness, anal tone normal and guaiac negative stool. Prostate is not enlarged and not tender. Right testis shows no mass, no swelling and no tenderness. Left testis  shows no mass, no swelling and no tenderness. Circumcised. No penile erythema or penile tenderness. No discharge found.  Musculoskeletal: Normal range of motion. He exhibits no edema, tenderness or deformity.  Lymphadenopathy:    He has no cervical adenopathy. No inguinal  adenopathy noted on the right or left side.  Neurological: He is alert and oriented to person, place, and time.  Skin: Skin is warm and dry. No rash noted. He is not diaphoretic.  Psychiatric: He has a normal mood and affect. His behavior is normal. Judgment and thought content normal.  Vitals reviewed.   Lab Results  Component Value Date   WBC 8.2 02/11/2018   HGB 13.9 02/11/2018   HCT 41.7 02/11/2018   PLT 257.0 02/11/2018   GLUCOSE 97 02/11/2018   CHOL 201 (H) 02/11/2018   TRIG 101.0 02/11/2018   HDL 39.10 02/11/2018   LDLCALC 142 (H) 02/11/2018   ALT 17 02/11/2018   AST 17 02/11/2018   NA 139 02/11/2018   K 3.9 02/11/2018   CL 104 02/11/2018   CREATININE 1.24 02/11/2018   BUN 14 02/11/2018   CO2 28 02/11/2018   TSH 1.88 02/11/2018   PSA 0.52 02/11/2018    Dg Chest 2 View  Result Date: 03/27/2014 CLINICAL DATA:  Cough, fever, congestion and rash. EXAM: CHEST - 2 VIEW COMPARISON:  09/30/2007 FINDINGS: The heart size and mediastinal contours are within normal limits. There is no evidence of pulmonary edema, consolidation, pneumothorax, nodule or pleural fluid. The visualized skeletal structures are unremarkable. IMPRESSION: No active disease. Electronically Signed   By: Irish Lack M.D.   On: 03/27/2014 14:20    Assessment & Plan:   Raylon was seen today for annual exam and hypertension.  Diagnoses and all orders for this visit:  Routine general medical examination at a health care facility- Exam completed, labs reviewed, vaccines reviewed, patient education material was given. -     Comprehensive metabolic panel; Future -     CBC with Differential/Platelet; Future -     Thyroid Panel With TSH; Future -     Urinalysis, Routine w reflex microscopic; Future -     VITAMIN D 25 Hydroxy (Vit-D Deficiency, Fractures); Future  Essential hypertension- He has new onset, stage II hypertension.  I have asked him to stop using decongestants.  I will treat the vitamin D  deficiency.  His EKG is negative for LVH.  The rest of his labs are negative for secondary causes or endorgan damage.  I will start treating this with an ARB and a thiazide diuretic. -     Lipid panel; Future -     PSA; Future -     EKG 12-Lead -     Azilsartan-Chlorthalidone (EDARBYCLOR) 40-25 MG TABS; Take 1 tablet by mouth daily.  Vitamin D deficiency -     Cholecalciferol 2000 units TABS; Take 2 tablets (4,000 Units total) by mouth daily.  Hyperlipidemia LDL goal <160- He does not have an elevated ASCVD risk score and he has achieved his LDL goal so I do not think statin and aspirin therapy is indicated.   I have discontinued Tashawn Erazo's pantoprazole. I am also having him start on Azilsartan-Chlorthalidone and Cholecalciferol.  Meds ordered this encounter  Medications  . Azilsartan-Chlorthalidone (EDARBYCLOR) 40-25 MG TABS    Sig: Take 1 tablet by mouth daily.    Dispense:  28 tablet    Refill:  0  . Cholecalciferol 2000 units TABS    Sig: Take 2 tablets (4,000  Units total) by mouth daily.    Dispense:  180 tablet    Refill:  1     Follow-up: Return in about 1 month (around 03/11/2018).  Sanda Linger, MD

## 2018-02-12 DIAGNOSIS — E785 Hyperlipidemia, unspecified: Secondary | ICD-10-CM | POA: Insufficient documentation

## 2018-02-12 LAB — THYROID PANEL WITH TSH
FREE THYROXINE INDEX: 2.7 (ref 1.4–3.8)
T3 Uptake: 35 % (ref 22–35)
T4, Total: 7.8 ug/dL (ref 4.9–10.5)
TSH: 1.88 m[IU]/L (ref 0.40–4.50)

## 2018-03-15 ENCOUNTER — Ambulatory Visit: Payer: BC Managed Care – PPO | Admitting: Internal Medicine

## 2018-03-15 ENCOUNTER — Encounter: Payer: Self-pay | Admitting: Internal Medicine

## 2018-03-15 DIAGNOSIS — I10 Essential (primary) hypertension: Secondary | ICD-10-CM

## 2018-03-15 MED ORDER — NEBIVOLOL HCL 5 MG PO TABS: 5.0000 mg | ORAL_TABLET | Freq: Every day | ORAL | 0 refills | Status: DC

## 2018-03-15 MED ORDER — AZILSARTAN-CHLORTHALIDONE 40-25 MG PO TABS
1.0000 | ORAL_TABLET | Freq: Every day | ORAL | 0 refills | Status: DC
Start: 2018-03-15 — End: 2018-03-30

## 2018-03-15 NOTE — Patient Instructions (Signed)

## 2018-03-15 NOTE — Progress Notes (Signed)
Subjective:  Patient ID: Terry Terry [name], male    DOB: 01-07-1976  Age: 42 y.o. MRN: [id number]  CC: Hypertension   HPI Terry Terry [name] presents for f/up - He returns for a blood pressure Terry [name].  He ran out of his antihypertensives about 5 days ago.  He denies any recent episodes of headache, blurred vision, chest pain, shortness of breath, palpitations, edema, or fatigue.  He is taking the vitamin D supplement.  Outpatient Medications Prior to Visit  Medication Sig Dispense Refill  . Cholecalciferol 2000 units TABS Take 2 tablets (4,000 Units total) by mouth daily. (Patient not taking: Reported on 03/15/2018) 180 tablet 1  . Azilsartan-Chlorthalidone (EDARBYCLOR) 40-25 MG TABS Take 1 tablet by mouth daily. (Patient not taking: Reported on 03/15/2018) 28 tablet 0   No facility-administered medications prior to visit.     ROS Review of Systems  Constitutional: Negative.  Negative for diaphoresis and fatigue.  HENT: Negative.   Eyes: Negative for visual disturbance.  Respiratory: Negative.  Negative for apnea, cough, chest tightness, shortness of breath and wheezing.   Cardiovascular: Negative for chest pain, palpitations and leg swelling.  Gastrointestinal: Negative.  Negative for abdominal pain, diarrhea, nausea and vomiting.  Endocrine: Negative.   Genitourinary: Negative.  Negative for difficulty urinating.  Musculoskeletal: Negative.  Negative for arthralgias and neck pain.  Skin: Negative for color change and pallor.  Allergic/Immunologic: Negative.   Neurological: Negative.  Negative for dizziness, weakness, light-headedness and headaches.  Hematological: Negative for adenopathy. Does not bruise/bleed easily.  Psychiatric/Behavioral: Negative.     Objective:  BP (!) 158/110 (BP Location: Left Arm, Patient Position: Sitting, Cuff Size: Large)   Pulse 65   Temp 98.7 F (37.1 C) (Oral)   Resp 16   Ht 5\' 11"  (1.803 m)   Wt 255 lb (115.7 kg)   SpO2 98%   BMI 35.57  kg/m   BP Readings from Last 3 Encounters:  03/15/18 (!) 158/110  02/11/18 (!) 180/100  01/10/15 120/80    Wt Readings from Last 3 Encounters:  03/15/18 255 lb (115.7 kg)  02/11/18 257 lb (116.6 kg)  01/10/15 246 lb (111.6 kg)    Physical Exam  Constitutional: He is oriented to person, place, and time. No distress.  HENT:  Mouth/Throat: Oropharynx is clear and moist. No oropharyngeal exudate.  Eyes: Conjunctivae are normal. No scleral icterus.  Neck: Normal range of motion. Neck supple. No JVD present. No thyromegaly present.  Cardiovascular: Normal rate, regular rhythm and normal heart sounds. Exam reveals no friction rub.  No murmur heard. Pulmonary/Chest: Effort normal and breath sounds normal. No stridor. He has no wheezes. He has no rhonchi. He has no rales.  Abdominal: Soft. Normal appearance and bowel sounds are normal. He exhibits no mass. There is no hepatosplenomegaly. There is no tenderness. No hernia.  Musculoskeletal: Normal range of motion. He exhibits no edema, tenderness or deformity.  Lymphadenopathy:    He has no cervical adenopathy.  Neurological: He is alert and oriented to person, place, and time.  Skin: Skin is warm and dry. No rash noted. He is not diaphoretic.  Vitals reviewed.   Lab Results  Component Value Date   WBC 8.2 02/11/2018   HGB 13.9 02/11/2018   HCT 41.7 02/11/2018   PLT 257.0 02/11/2018   GLUCOSE 97 02/11/2018   CHOL 201 (H) 02/11/2018   TRIG 101.0 02/11/2018   HDL 39.10 02/11/2018   LDLCALC 142 (H) 02/11/2018   ALT 17 02/11/2018   AST 17 02/11/2018  NA 139 02/11/2018   K 3.9 02/11/2018   CL 104 02/11/2018   CREATININE 1.24 02/11/2018   BUN 14 02/11/2018   CO2 28 02/11/2018   TSH 1.88 02/11/2018   PSA 0.52 02/11/2018    Dg Chest 2 View  Result Date: 03/27/2014 CLINICAL DATA:  Cough, fever, congestion and rash. EXAM: CHEST - 2 VIEW COMPARISON:  09/30/2007 FINDINGS: The heart size and mediastinal contours are within normal  limits. There is no evidence of pulmonary edema, consolidation, pneumothorax, nodule or pleural fluid. The visualized skeletal structures are unremarkable. IMPRESSION: No active disease. Electronically Signed   By: Irish Lack M.D.   On: 03/27/2014 14:20    Assessment & Plan:   Austyn was seen today for hypertension.  Diagnoses and all orders for this visit:  Essential hypertension- His blood pressure is not adequately well controlled.  I think he will ultimately need 3 agents to control his blood pressure.  Will restart the ARB and thiazide diuretic and will also add nebivolol. -     Azilsartan-Chlorthalidone (EDARBYCLOR) 40-25 MG TABS; Take 1 tablet by mouth daily. -     nebivolol (BYSTOLIC) 5 MG tablet; Take 1 tablet (5 mg total) by mouth daily.   I am having [name] start on nebivolol. I am also having him maintain his Cholecalciferol and Azilsartan-Chlorthalidone.  Meds ordered this encounter  Medications  . Azilsartan-Chlorthalidone (EDARBYCLOR) 40-25 MG TABS    Sig: Take 1 tablet by mouth daily.    Dispense:  90 tablet    Refill:  0  . nebivolol (BYSTOLIC) 5 MG tablet    Sig: Take 1 tablet (5 mg total) by mouth daily.    Dispense:  90 tablet    Refill:  0     Follow-up: Return in about 3 months (around 06/15/2018).  Sanda Linger, MD

## 2018-03-30 ENCOUNTER — Telehealth: Payer: Self-pay | Admitting: Internal Medicine

## 2018-03-30 DIAGNOSIS — I10 Essential (primary) hypertension: Secondary | ICD-10-CM

## 2018-03-30 MED ORDER — AZILSARTAN-CHLORTHALIDONE 40-25 MG PO TABS: 1.0000 | ORAL_TABLET | Freq: Every day | ORAL | 2 refills | Status: DC

## 2018-03-30 NOTE — Telephone Encounter (Signed)
Resent rx to [facility],,/lmb

## 2018-03-30 NOTE — Telephone Encounter (Signed)
Copied from CRM [phone]#[id number]. Topic: Quick Communication - Rx Refill/Question >> Mar 30, 2018  2:49 PM Alexander BergeronBarksdale, Harvey B wrote: Pharmacy did not receive medication  Medication: Azilsartan-Chlorthalidone (EDARBYCLOR) 40-25 MG TABS [[id number]][[id number]]   Has the patient contacted their pharmacy? Yes.   (Agent: If no, request that the patient contact the pharmacy for the refill.) (Agent: If yes, when and what did the pharmacy advise?)  Preferred Pharmacy (with phone number or street name): [facility]  Agent: Please be advised that RX refills may take up to 3 business days. We ask that you follow-up with your pharmacy.

## 2018-12-01 ENCOUNTER — Telehealth: Payer: Self-pay

## 2018-12-01 NOTE — Telephone Encounter (Signed)
Can you call pt and have him come in for BP follow up? He was to come in around September but did not come in. He was started on a BP med in June and need to follow up with him.   We also need to know if he has had his flu vaccine.

## 2018-12-02 NOTE — Telephone Encounter (Signed)
Left message for patient to call back to scheduled.

## 2019-04-20 ENCOUNTER — Encounter: Payer: Self-pay | Admitting: Internal Medicine

## 2019-04-20 ENCOUNTER — Other Ambulatory Visit (INDEPENDENT_AMBULATORY_CARE_PROVIDER_SITE_OTHER): Payer: BC Managed Care – PPO

## 2019-04-20 ENCOUNTER — Other Ambulatory Visit: Payer: Self-pay

## 2019-04-20 ENCOUNTER — Ambulatory Visit (INDEPENDENT_AMBULATORY_CARE_PROVIDER_SITE_OTHER): Payer: BC Managed Care – PPO | Admitting: Internal Medicine

## 2019-04-20 VITALS — BP 166/106 | HR 71 | Temp 98.3°F | Resp 16 | Ht 71.0 in | Wt 242.0 lb

## 2019-04-20 DIAGNOSIS — Z0000 Encounter for general adult medical examination without abnormal findings: Secondary | ICD-10-CM

## 2019-04-20 DIAGNOSIS — Z125 Encounter for screening for malignant neoplasm of prostate: Secondary | ICD-10-CM

## 2019-04-20 DIAGNOSIS — E785 Hyperlipidemia, unspecified: Secondary | ICD-10-CM

## 2019-04-20 DIAGNOSIS — E559 Vitamin D deficiency, unspecified: Secondary | ICD-10-CM

## 2019-04-20 DIAGNOSIS — I10 Essential (primary) hypertension: Secondary | ICD-10-CM | POA: Diagnosis not present

## 2019-04-20 LAB — CBC WITH DIFFERENTIAL/PLATELET
Basophils Absolute: 0 10*3/uL (ref 0.0–0.1)
Basophils Relative: 0.6 % (ref 0.0–3.0)
Eosinophils Absolute: 0.1 10*3/uL (ref 0.0–0.7)
Eosinophils Relative: 1.1 % (ref 0.0–5.0)
HCT: 42.4 % (ref 39.0–52.0)
Hemoglobin: 14 g/dL (ref 13.0–17.0)
Lymphocytes Relative: 36.6 % (ref 12.0–46.0)
Lymphs Abs: 3 10*3/uL (ref 0.7–4.0)
MCHC: 32.9 g/dL (ref 30.0–36.0)
MCV: 85.9 fl (ref 78.0–100.0)
Monocytes Absolute: 0.4 10*3/uL (ref 0.1–1.0)
Monocytes Relative: 4.7 % (ref 3.0–12.0)
Neutro Abs: 4.7 10*3/uL (ref 1.4–7.7)
Neutrophils Relative %: 57 % (ref 43.0–77.0)
Platelets: 278 10*3/uL (ref 150.0–400.0)
RBC: 4.94 Mil/uL (ref 4.22–5.81)
RDW: 15.5 % (ref 11.5–15.5)
WBC: 8.2 10*3/uL (ref 4.0–10.5)

## 2019-04-20 LAB — BASIC METABOLIC PANEL
BUN: 12 mg/dL (ref 6–23)
CO2: 30 mEq/L (ref 19–32)
Calcium: 9.7 mg/dL (ref 8.4–10.5)
Chloride: 103 mEq/L (ref 96–112)
Creatinine, Ser: 1.21 mg/dL (ref 0.40–1.50)
GFR: 79.85 mL/min (ref >60.00)
Glucose, Bld: 79 mg/dL (ref 70–99)
Potassium: 4.4 mEq/L (ref 3.5–5.1)
Sodium: 139 mEq/L (ref 135–145)

## 2019-04-20 LAB — URINALYSIS, ROUTINE W REFLEX MICROSCOPIC
Bilirubin Urine: NEGATIVE
Hgb urine dipstick: NEGATIVE
Ketones, ur: NEGATIVE
Leukocytes,Ua: NEGATIVE
Nitrite: NEGATIVE
Specific Gravity, Urine: 1.025 (ref 1.000–1.030)
Total Protein, Urine: NEGATIVE
Urine Glucose: NEGATIVE
Urobilinogen, UA: 1 (ref 0.0–1.0)
pH: 5.5 (ref 5.0–8.0)

## 2019-04-20 LAB — LIPID PANEL
Cholesterol: 203 mg/dL — ABNORMAL HIGH (ref 0–200)
HDL: 36.8 mg/dL — ABNORMAL LOW
NonHDL: 166.49
Total CHOL/HDL Ratio: 6
Triglycerides: 233 mg/dL — ABNORMAL HIGH (ref 0.0–149.0)
VLDL: 46.6 mg/dL — ABNORMAL HIGH (ref 0.0–40.0)

## 2019-04-20 LAB — HEPATIC FUNCTION PANEL
ALT: 13 U/L (ref 0–53)
AST: 17 U/L (ref 0–37)
Albumin: 4.6 g/dL (ref 3.5–5.2)
Alkaline Phosphatase: 78 U/L (ref 39–117)
Bilirubin, Direct: 0.1 mg/dL (ref 0.0–0.3)
Total Bilirubin: 0.4 mg/dL (ref 0.2–1.2)
Total Protein: 7.7 g/dL (ref 6.0–8.3)

## 2019-04-20 LAB — TSH: TSH: 1.26 u[IU]/mL (ref 0.35–4.50)

## 2019-04-20 LAB — PSA: PSA: 0.48 ng/mL (ref 0.10–4.00)

## 2019-04-20 LAB — VITAMIN D 25 HYDROXY (VIT D DEFICIENCY, FRACTURES): VITD: 33 ng/mL (ref 30.00–100.00)

## 2019-04-20 LAB — LDL CHOLESTEROL, DIRECT: Direct LDL: 135 mg/dL

## 2019-04-20 MED ORDER — INDAPAMIDE 1.25 MG PO TABS: 1.2500 mg | ORAL_TABLET | Freq: Every day | ORAL | 0 refills | Status: DC

## 2019-04-20 MED ORDER — NEBIVOLOL HCL 5 MG PO TABS: 5.0000 mg | ORAL_TABLET | Freq: Every day | ORAL | 0 refills | Status: DC

## 2019-04-20 NOTE — Progress Notes (Signed)
Subjective:  Patient ID: Terry [name], male    DOB: 15-Jan-1976  Age: 43 y.o. MRN: [id number]  CC: Annual Exam and Hypertension   HPI Terry [name] presents for a CPX.  He is concerned that his blood pressure is not well controlled.  I previously prescribed Edarbyclor but he is not been taking it.  He has been working on his lifestyle modifications.  He is very active and denies any recent episodes of CP, DOE, palpitations, edema, or fatigue.  Outpatient Medications Prior to Visit  Medication Sig Dispense Refill   Cholecalciferol 2000 units TABS Take 2 tablets (4,000 Units total) by mouth daily. 180 tablet 1   nebivolol (BYSTOLIC) 5 MG tablet Take 1 tablet (5 mg total) by mouth daily. 90 tablet 0   Azilsartan-Chlorthalidone (EDARBYCLOR) 40-25 MG TABS Take 1 tablet by mouth daily. (Patient not taking: Reported on 04/20/2019) 30 tablet 2   No facility-administered medications prior to visit.     ROS Review of Systems  Constitutional: Negative.  Negative for diaphoresis and unexpected weight change.  HENT: Negative.   Eyes: Negative.  Negative for visual disturbance.  Respiratory: Negative for cough, chest tightness, shortness of breath and wheezing.   Cardiovascular: Negative for chest pain, palpitations and leg swelling.  Gastrointestinal: Negative for abdominal pain, constipation, diarrhea, nausea and vomiting.  Endocrine: Negative.   Genitourinary: Negative.  Negative for difficulty urinating.  Musculoskeletal: Negative for arthralgias and myalgias.  Skin: Negative.  Negative for color change and pallor.  Neurological: Negative.  Negative for dizziness, weakness, light-headedness and headaches.  Hematological: Negative for adenopathy. Does not bruise/bleed easily.  Psychiatric/Behavioral: Negative.     Objective:  BP (!) 166/106 (BP Location: Left Arm, Patient Position: Sitting, Cuff Size: Large)    Pulse 71    Temp 98.3 F (36.8 C) (Oral)    Resp 16    Ht 5'  11" (1.803 m)    Wt 242 lb (109.8 kg)    SpO2 99%    BMI 33.75 kg/m   BP Readings from Last 3 Encounters:  04/20/19 (!) 166/106  03/15/18 (!) 158/110  02/11/18 (!) 180/100    Wt Readings from Last 3 Encounters:  04/20/19 242 lb (109.8 kg)  03/15/18 255 lb (115.7 kg)  02/11/18 257 lb (116.6 kg)    Physical Exam Constitutional:      Appearance: He is not ill-appearing or diaphoretic.  HENT:     Nose: Nose normal.     Mouth/Throat:     Mouth: Mucous membranes are moist.     Pharynx: No oropharyngeal exudate.  Eyes:     General: No scleral icterus.    Conjunctiva/sclera: Conjunctivae normal.  Neck:     Musculoskeletal: Normal range of motion. No neck rigidity or muscular tenderness.  Cardiovascular:     Rate and Rhythm: Normal rate and regular rhythm.     Heart sounds: No murmur.  Pulmonary:     Effort: Pulmonary effort is normal. No respiratory distress.     Breath sounds: No stridor. No wheezing, rhonchi or rales.  Chest:     Comments: EKG ----  Sinus  Rhythm  -First degree A-V block  PRi = 244 BORDERLINE RHYTHM Abdominal:     General: Abdomen is protuberant. Bowel sounds are normal. There is no distension.     Palpations: There is no hepatomegaly, splenomegaly or mass.     Tenderness: There is no abdominal tenderness.     Hernia: There is no hernia in the left inguinal area  or right inguinal area.  Genitourinary:    Pubic Area: No rash.      Penis: Normal. No discharge, swelling or lesions.      Scrotum/Testes: Normal.        Right: Mass, tenderness or swelling not present.        Left: Mass, tenderness or swelling not present.     Epididymis:     Right: Normal. Not inflamed or enlarged. No mass.     Left: Normal. Not inflamed or enlarged. No mass.     Prostate: Normal. Not enlarged, not tender and no nodules present.     Rectum: Normal. Guaiac result negative. No mass, tenderness, anal fissure, external hemorrhoid or internal hemorrhoid. Normal anal tone.    Musculoskeletal: Normal range of motion.     Right lower leg: No edema.     Left lower leg: No edema.  Lymphadenopathy:     Cervical: No cervical adenopathy.     Lower Body: No right inguinal adenopathy. No left inguinal adenopathy.  Skin:    General: Skin is warm and dry.     Coloration: Skin is not pale.     Findings: No erythema.  Neurological:     General: No focal deficit present.     Mental Status: He is alert and oriented to person, place, and time. Mental status is at baseline.  Psychiatric:        Mood and Affect: Mood normal.        Behavior: Behavior normal.     Lab Results  Component Value Date   WBC 8.2 04/20/2019   HGB 14.0 04/20/2019   HCT 42.4 04/20/2019   PLT 278.0 04/20/2019   GLUCOSE 79 04/20/2019   CHOL 203 (H) 04/20/2019   TRIG 233.0 (H) 04/20/2019   HDL 36.80 (L) 04/20/2019   LDLDIRECT 135.0 04/20/2019   LDLCALC 142 (H) 02/11/2018   ALT 13 04/20/2019   AST 17 04/20/2019   NA 139 04/20/2019   K 4.4 04/20/2019   CL 103 04/20/2019   CREATININE 1.21 04/20/2019   BUN 12 04/20/2019   CO2 30 04/20/2019   TSH 1.26 04/20/2019   PSA 0.48 04/20/2019    Dg Chest 2 View  Result Date: 03/27/2014 CLINICAL DATA:  Cough, fever, congestion and rash. EXAM: CHEST - 2 VIEW COMPARISON:  09/30/2007 FINDINGS: The heart size and mediastinal contours are within normal limits. There is no evidence of pulmonary edema, consolidation, pneumothorax, nodule or pleural fluid. The visualized skeletal structures are unremarkable. IMPRESSION: No active disease. Electronically Signed   By: Irish LackGlenn  Yamagata M.D.   On: 03/27/2014 14:20    Assessment & Plan:   Terry [name] was seen today for annual exam and hypertension.  Diagnoses and all orders for this visit:  Essential hypertension- His blood pressure is not well controlled.  His EKG is negative for LVH or ischemia.  Labs are negative for secondary causes or endorgan damage.  I recommended that he start treating this with  indapamide and carvedilol. -     CBC with Differential/Platelet; Future -     Basic metabolic panel; Future -     TSH; Future -     Urinalysis, Routine w reflex microscopic; Future -     Hepatic function panel; Future -     VITAMIN D 25 Hydroxy (Vit-D Deficiency, Fractures); Future -     EKG 12-Lead -     Discontinue: nebivolol (BYSTOLIC) 5 MG tablet; Take 1 tablet (5 mg total) by mouth daily. -  indapamide (LOZOL) 1.25 MG tablet; Take 1 tablet (1.25 mg total) by mouth daily. -     carvedilol (COREG) 3.125 MG tablet; Take 1 tablet (3.125 mg total) by mouth 2 (two) times daily with a meal.  Routine general medical examination at a health care facility- Exam completed, labs reviewed, vaccines reviewed, patient education material was given. -     Lipid panel; Future -     PSA; Future  Vitamin D deficiency- His vitamin D level is normal now. -     VITAMIN D 25 Hydroxy (Vit-D Deficiency, Fractures); Future  Hyperlipidemia LDL goal <160- He does not have an elevated ASCVD risk score so I did not recommend a statin for CV risk reduction.   I have discontinued Terry [name]'s nebivolol, Azilsartan-Chlorthalidone, and nebivolol. I am also having him start on indapamide and carvedilol. Additionally, I am having him maintain his Cholecalciferol.  Meds ordered this encounter  Medications   DISCONTD: nebivolol (BYSTOLIC) 5 MG tablet    Sig: Take 1 tablet (5 mg total) by mouth daily.    Dispense:  90 tablet    Refill:  0   indapamide (LOZOL) 1.25 MG tablet    Sig: Take 1 tablet (1.25 mg total) by mouth daily.    Dispense:  90 tablet    Refill:  0   carvedilol (COREG) 3.125 MG tablet    Sig: Take 1 tablet (3.125 mg total) by mouth 2 (two) times daily with a meal.    Dispense:  180 tablet    Refill:  0     Follow-up: Return in about 6 weeks (around 06/01/2019).  Scarlette Calico, MD

## 2019-04-20 NOTE — Patient Instructions (Signed)

## 2019-04-21 ENCOUNTER — Encounter: Payer: Self-pay | Admitting: Internal Medicine

## 2019-04-21 MED ORDER — CARVEDILOL 3.125 MG PO TABS: 3.1250 mg | ORAL_TABLET | Freq: Two times a day (BID) | ORAL | 0 refills | Status: DC

## 2019-04-25 ENCOUNTER — Other Ambulatory Visit: Payer: Self-pay | Admitting: Internal Medicine

## 2019-04-25 ENCOUNTER — Telehealth: Payer: Self-pay

## 2019-04-25 NOTE — Telephone Encounter (Signed)
I got a note from his insurance company that they would not cover Bystolic so this has been changed to carvedilol and sent to his pharmacy  TJ

## 2019-04-25 NOTE — Telephone Encounter (Signed)
Spoke to pt and informed that the Bystolic is not on his med list. Pt stated that it was highlighted on his medication list when he left after last OV.  Can you confirm is pt is to take the Bystolic 5 mg? If so, can an rx be sent to the pharmacy.

## 2019-04-25 NOTE — Telephone Encounter (Signed)
Pt contacted and informed of same.  

## 2019-04-25 NOTE — Telephone Encounter (Signed)
Copied from [place] [id number]. Topic: Quick Communication - Rx Refill/Question >> Apr 25, 2019 12:27 PM Erick Blinks wrote: Medication: nebivolol (BYSTOLIC) 5 MG tablet [[id number]]  pt has questions regarding this discontinued Rx. Please advise  Best contact: [phone]

## 2019-07-15 ENCOUNTER — Other Ambulatory Visit: Payer: Self-pay | Admitting: Internal Medicine

## 2019-07-15 DIAGNOSIS — I10 Essential (primary) hypertension: Secondary | ICD-10-CM

## 2019-07-15 NOTE — Telephone Encounter (Signed)
Requested medication (s) are due for refill today: yes  Requested medication (s) are on the active medication list: yes  Last refill:  04/21/2019  Future visit scheduled: yes  Notes to clinic:  Beta blockers failed  Requested Prescriptions  Pending Prescriptions Disp Refills   carvedilol (COREG) 3.125 MG tablet 180 tablet 0    Sig: Take 1 tablet (3.125 mg total) by mouth 2 (two) times daily with a meal.     Cardiovascular:  Beta Blockers Failed - 07/15/2019  4:02 PM      Failed - Last BP in normal range    BP Readings from Last 1 Encounters:  04/20/19 (!) 166/106         Passed - Last Heart Rate in normal range    Pulse Readings from Last 1 Encounters:  04/20/19 71         Passed - Valid encounter within last 6 months    Recent Outpatient Visits          2 months ago Essential hypertension   [place], Thomas L, MD   1 year ago Essential hypertension   [place], Thomas L, MD   1 year ago Routine general medical examination at a health care facility   [place], MD   4 years ago Post-traumatic wound infection, initial encounter   [place], [place], MD      Future Appointments            In 1 week Ronnald Ramp Arvid Right, MD [place], [facility]

## 2019-07-15 NOTE — Telephone Encounter (Signed)
Medication Refill - Medication: carvedilol (COREG) 3.125 MG tablet/indapamide (LOZOL) 1.25 MG tablet  Has the patient contacted their pharmacy? Yes.   (Agent: If no, request that the patient contact the pharmacy for the refill.) (Agent: If yes, when and what did the pharmacy advise?)  Preferred Pharmacy (with phone number or street name):  [facility] [place], [place] - [address] [place] [phone] (Phone) [phone] (Fax)     Agent: Please be advised that RX refills may take up to 3 business days. We ask that you follow-up with your pharmacy.

## 2019-07-18 ENCOUNTER — Other Ambulatory Visit: Payer: Self-pay | Admitting: Internal Medicine

## 2019-07-18 DIAGNOSIS — I10 Essential (primary) hypertension: Secondary | ICD-10-CM

## 2019-07-18 MED ORDER — CARVEDILOL 3.125 MG PO TABS: 3.1250 mg | ORAL_TABLET | Freq: Two times a day (BID) | ORAL | 0 refills | Status: DC

## 2019-07-18 MED ORDER — INDAPAMIDE 1.25 MG PO TABS: 1.2500 mg | ORAL_TABLET | Freq: Every day | ORAL | 0 refills | Status: DC

## 2019-07-25 ENCOUNTER — Ambulatory Visit (INDEPENDENT_AMBULATORY_CARE_PROVIDER_SITE_OTHER): Payer: BC Managed Care – PPO | Admitting: Internal Medicine

## 2019-07-25 ENCOUNTER — Other Ambulatory Visit (INDEPENDENT_AMBULATORY_CARE_PROVIDER_SITE_OTHER): Payer: BC Managed Care – PPO

## 2019-07-25 ENCOUNTER — Encounter: Payer: Self-pay | Admitting: Internal Medicine

## 2019-07-25 ENCOUNTER — Other Ambulatory Visit: Payer: Self-pay

## 2019-07-25 VITALS — BP 138/88 | HR 64 | Temp 98.2°F | Resp 16 | Ht 71.0 in | Wt 233.2 lb

## 2019-07-25 DIAGNOSIS — I10 Essential (primary) hypertension: Secondary | ICD-10-CM | POA: Diagnosis not present

## 2019-07-25 LAB — BASIC METABOLIC PANEL
BUN: 20 mg/dL (ref 6–23)
CO2: 30 mEq/L (ref 19–32)
Calcium: 9.2 mg/dL (ref 8.4–10.5)
Chloride: 100 mEq/L (ref 96–112)
Creatinine, Ser: 1.31 mg/dL (ref 0.40–1.50)
GFR: 72.76 mL/min (ref >60.00)
Glucose, Bld: 93 mg/dL (ref 70–99)
Potassium: 3.7 mEq/L (ref 3.5–5.1)
Sodium: 136 mEq/L (ref 135–145)

## 2019-07-25 NOTE — Progress Notes (Signed)
Subjective:  Patient ID: Terry [name], male    DOB: 07-13-1976  Age: 43 y.o. MRN: [id number]  CC: Hypertension   HPI Herman Fiero presents for a BP check - He does not monitor his BP. He feels well on the current combination. He has been working on his lifestyle modifications.  Outpatient Medications Prior to Visit  Medication Sig Dispense Refill  . carvedilol (COREG) 3.125 MG tablet Take 1 tablet (3.125 mg total) by mouth 2 (two) times daily with a meal. 180 tablet 0  . Cholecalciferol 2000 units TABS Take 2 tablets (4,000 Units total) by mouth daily. 180 tablet 1  . indapamide (LOZOL) 1.25 MG tablet Take 1 tablet (1.25 mg total) by mouth daily. 90 tablet 0   No facility-administered medications prior to visit.     ROS Review of Systems  Constitutional: Negative for diaphoresis and fatigue.  HENT: Negative.   Eyes: Negative.  Negative for visual disturbance.  Respiratory: Negative for cough, chest tightness, shortness of breath and wheezing.   Cardiovascular: Negative for chest pain, palpitations and leg swelling.  Gastrointestinal: Negative for abdominal pain, constipation, diarrhea, nausea and vomiting.  Endocrine: Negative.   Genitourinary: Negative for difficulty urinating.  Musculoskeletal: Negative for arthralgias and myalgias.  Skin: Negative.  Negative for color change and pallor.  Neurological: Negative.  Negative for dizziness, weakness and headaches.  Hematological: Negative for adenopathy. Does not bruise/bleed easily.  Psychiatric/Behavioral: Negative.     Objective:  BP 138/88 (BP Location: Left Arm, Patient Position: Sitting, Cuff Size: Large)   Pulse 64   Temp 98.2 F (36.8 C) (Oral)   Resp 16   Ht 5\' 11"  (1.803 m)   Wt 233 lb 4 oz (105.8 kg)   SpO2 98%   BMI 32.53 kg/m   BP Readings from Last 3 Encounters:  07/25/19 138/88  04/20/19 (!) 166/106  03/15/18 (!) 158/110    Wt Readings from Last 3 Encounters:  07/25/19 233 lb 4 oz  (105.8 kg)  04/20/19 242 lb (109.8 kg)  03/15/18 255 lb (115.7 kg)    Physical Exam Vitals signs reviewed.  Constitutional:      Appearance: Normal appearance.  HENT:     Nose: Nose normal.     Mouth/Throat:     Mouth: Mucous membranes are moist.  Eyes:     General: No scleral icterus.    Conjunctiva/sclera: Conjunctivae normal.  Neck:     Musculoskeletal: Neck supple.  Cardiovascular:     Rate and Rhythm: Normal rate and regular rhythm.     Heart sounds: No murmur.  Pulmonary:     Effort: Pulmonary effort is normal.     Breath sounds: No stridor. No wheezing, rhonchi or rales.  Abdominal:     General: Abdomen is flat. Bowel sounds are normal. There is no distension.     Palpations: There is no hepatomegaly or splenomegaly.     Tenderness: There is no abdominal tenderness.  Musculoskeletal: Normal range of motion.     Right lower leg: No edema.     Left lower leg: No edema.  Lymphadenopathy:     Cervical: No cervical adenopathy.  Skin:    General: Skin is warm and dry.  Neurological:     General: No focal deficit present.     Mental Status: He is alert.     Lab Results  Component Value Date   WBC 8.2 04/20/2019   HGB 14.0 04/20/2019   HCT 42.4 04/20/2019   PLT 278.0 04/20/2019  GLUCOSE 93 07/25/2019   CHOL 203 (H) 04/20/2019   TRIG 233.0 (H) 04/20/2019   HDL 36.80 (L) 04/20/2019   LDLDIRECT 135.0 04/20/2019   LDLCALC 142 (H) 02/11/2018   ALT 13 04/20/2019   AST 17 04/20/2019   NA 136 07/25/2019   K 3.7 07/25/2019   CL 100 07/25/2019   CREATININE 1.31 07/25/2019   BUN 20 07/25/2019   CO2 30 07/25/2019   TSH 1.26 04/20/2019   PSA 0.48 04/20/2019    Dg Chest 2 View  Result Date: 03/27/2014 CLINICAL DATA:  Cough, fever, congestion and rash. EXAM: CHEST - 2 VIEW COMPARISON:  09/30/2007 FINDINGS: The heart size and mediastinal contours are within normal limits. There is no evidence of pulmonary edema, consolidation, pneumothorax, nodule or pleural fluid.  The visualized skeletal structures are unremarkable. IMPRESSION: No active disease. Electronically Signed   By: Aletta Edouard M.D.   On: 03/27/2014 14:20    Assessment & Plan:   Khari was seen today for hypertension.  Diagnoses and all orders for this visit:  Essential hypertension- He has not quite achieved his blood pressure goal of less than 130/80.  His electrolytes and renal function are normal.  I think the best thing to do is to continue the current regimen and for him to continue to improve his select lifestyle modifications. -     Basic metabolic panel; Future   I am having Anish Weigelt maintain his Cholecalciferol, carvedilol, and indapamide.  No orders of the defined types were placed in this encounter.    Follow-up: Return in about 6 months (around 01/22/2020).  Scarlette Calico, MD

## 2019-07-25 NOTE — Patient Instructions (Signed)

## 2019-10-13 ENCOUNTER — Other Ambulatory Visit: Payer: Self-pay | Admitting: Internal Medicine

## 2019-10-13 DIAGNOSIS — I10 Essential (primary) hypertension: Secondary | ICD-10-CM

## 2020-04-18 ENCOUNTER — Other Ambulatory Visit: Payer: Self-pay | Admitting: Internal Medicine

## 2020-04-18 DIAGNOSIS — I10 Essential (primary) hypertension: Secondary | ICD-10-CM

## 2020-04-23 ENCOUNTER — Other Ambulatory Visit: Payer: Self-pay

## 2020-04-23 ENCOUNTER — Ambulatory Visit (INDEPENDENT_AMBULATORY_CARE_PROVIDER_SITE_OTHER): Payer: BC Managed Care – PPO | Admitting: Internal Medicine

## 2020-04-23 ENCOUNTER — Encounter: Payer: Self-pay | Admitting: Internal Medicine

## 2020-04-23 VITALS — BP 162/98 | HR 66 | Temp 98.0°F | Resp 16 | Ht 71.0 in | Wt 248.0 lb

## 2020-04-23 DIAGNOSIS — E785 Hyperlipidemia, unspecified: Secondary | ICD-10-CM | POA: Diagnosis not present

## 2020-04-23 DIAGNOSIS — Z0000 Encounter for general adult medical examination without abnormal findings: Secondary | ICD-10-CM | POA: Diagnosis not present

## 2020-04-23 DIAGNOSIS — E559 Vitamin D deficiency, unspecified: Secondary | ICD-10-CM

## 2020-04-23 DIAGNOSIS — I10 Essential (primary) hypertension: Secondary | ICD-10-CM | POA: Diagnosis not present

## 2020-04-23 DIAGNOSIS — K219 Gastro-esophageal reflux disease without esophagitis: Secondary | ICD-10-CM

## 2020-04-23 DIAGNOSIS — Z1159 Encounter for screening for other viral diseases: Secondary | ICD-10-CM

## 2020-04-23 MED ORDER — INDAPAMIDE 1.25 MG PO TABS: ORAL_TABLET | ORAL | 1 refills | Status: DC

## 2020-04-23 MED ORDER — CARVEDILOL 3.125 MG PO TABS: ORAL_TABLET | ORAL | 1 refills | Status: DC

## 2020-04-23 MED ORDER — ESOMEPRAZOLE MAGNESIUM 40 MG PO CPDR: 40.0000 mg | DELAYED_RELEASE_CAPSULE | Freq: Every day | ORAL | 1 refills | Status: AC

## 2020-04-23 NOTE — Progress Notes (Signed)
Subjective:  Patient ID: Terry [name], male    DOB: Feb 14, 1976  Age: 44 y.o. MRN: [id number]  CC: Annual Exam, Hypertension, and Hyperlipidemia  This visit occurred during the SARS-CoV-2 public health emergency.  Safety protocols were in place, including screening questions prior to the visit, additional usage of staff PPE, and extensive cleaning of exam room while observing appropriate contact time as indicated for disinfecting solutions.    HPI Terry [name] presents for a CPX.  He ran put of his antihypertensive meds about 3 days ago.  He denies headache, blurred vision, chest pain, shortness of breath, diaphoresis, dizziness, or lightheadedness.  He has recently had a couple of episodes of heartburn and got some symptom relief with peptobismol.  He denies odynophagia, dysphagia, loss of appetite, or weight loss.  Outpatient Medications Prior to Visit  Medication Sig Dispense Refill  . Cholecalciferol 2000 units TABS Take 2 tablets (4,000 Units total) by mouth daily. (Patient not taking: Reported on 04/23/2020) 180 tablet 1  . carvedilol (COREG) 3.125 MG tablet TAKE 1 TABLET(3.125 MG) BY MOUTH TWICE DAILY WITH A MEAL (Patient not taking: Reported on 04/23/2020) 180 tablet 1  . indapamide (LOZOL) 1.25 MG tablet TAKE 1 TABLET(1.25 MG) BY MOUTH DAILY (Patient not taking: Reported on 04/23/2020) 90 tablet 1   No facility-administered medications prior to visit.    ROS Review of Systems  All other systems reviewed and are negative.   Objective:  BP (!) 162/98 (BP Location: Left Arm)   Pulse 66   Temp 98 F (36.7 C) (Oral)   Resp 16   Wt 248 lb (112.5 kg)   SpO2 98%   BMI 34.59 kg/m   BP Readings from Last 3 Encounters:  04/23/20 (!) 162/98  07/25/19 138/88  04/20/19 (!) 166/106    Wt Readings from Last 3 Encounters:  04/23/20 248 lb (112.5 kg)  07/25/19 233 lb 4 oz (105.8 kg)  04/20/19 242 lb (109.8 kg)    Physical Exam Vitals reviewed.  Constitutional:       Appearance: Normal appearance.  HENT:     Nose: Nose normal.  Eyes:     General: No scleral icterus.    Pupils: Pupils are equal, round, and reactive to light.  Cardiovascular:     Rate and Rhythm: Normal rate and regular rhythm.     Heart sounds: No murmur heard.   Pulmonary:     Effort: Pulmonary effort is normal.     Breath sounds: No wheezing, rhonchi or rales.  Abdominal:     General: Abdomen is flat.     Palpations: There is no mass.     Tenderness: There is no abdominal tenderness. There is no guarding.     Hernia: No hernia is present. There is no hernia in the left inguinal area or right inguinal area.  Genitourinary:    Pubic Area: No rash.      Penis: Normal.      Testes: Normal.     Epididymis:     Right: Normal.     Left: Normal.     Prostate: Normal. Not enlarged, not tender and no nodules present.     Rectum: Normal.  Musculoskeletal:        General: Normal range of motion.     Cervical back: Normal range of motion and neck supple.     Right lower leg: No edema.     Left lower leg: No edema.  Lymphadenopathy:     Lower Body: No right  inguinal adenopathy. No left inguinal adenopathy.  Skin:    General: Skin is warm and dry.     Coloration: Skin is not pale.  Neurological:     General: No focal deficit present.     Mental Status: He is alert.  Psychiatric:        Mood and Affect: Mood normal.        Behavior: Behavior normal.     Lab Results  Component Value Date   WBC 7.3 04/23/2020   HGB 14.5 04/23/2020   HCT 44.2 04/23/2020   PLT 269 04/23/2020   GLUCOSE 88 04/23/2020   CHOL 227 (H) 04/23/2020   TRIG 252 (H) 04/23/2020   HDL 40 04/23/2020   LDLDIRECT 135.0 04/20/2019   LDLCALC 148 (H) 04/23/2020   ALT 15 04/23/2020   AST 20 04/23/2020   NA 138 04/23/2020   K 4.2 04/23/2020   CL 100 04/23/2020   CREATININE 1.23 04/23/2020   BUN 17 04/23/2020   CO2 26 04/23/2020   TSH 1.47 04/23/2020   PSA 0.4 04/23/2020    DG Chest 2 View  Result  Date: 03/27/2014 CLINICAL DATA:  Cough, fever, congestion and rash. EXAM: CHEST - 2 VIEW COMPARISON:  09/30/2007 FINDINGS: The heart size and mediastinal contours are within normal limits. There is no evidence of pulmonary edema, consolidation, pneumothorax, nodule or pleural fluid. The visualized skeletal structures are unremarkable. IMPRESSION: No active disease. Electronically Signed   By: Irish Lack M.D.   On: 03/27/2014 14:20    Assessment & Plan:   Edward was seen today for annual exam, hypertension and hyperlipidemia.  Diagnoses and all orders for this visit:  Essential hypertension- His blood pressure is not adequately well controlled due to noncompliance.  Labs are negative for secondary causes or endorgan damage.  I have asked him to restart indapamide and carvedilol. -     CBC with Differential/Platelet; Future -     BASIC METABOLIC PANEL WITH GFR; Future -     Urinalysis, Routine w reflex microscopic; Future -     TSH; Future -     Hepatic function panel; Future -     indapamide (LOZOL) 1.25 MG tablet; TAKE 1 TABLET(1.25 MG) BY MOUTH DAILY -     carvedilol (COREG) 3.125 MG tablet; TAKE 1 TABLET(3.125 MG) BY MOUTH TWICE DAILY WITH A MEAL -     Hepatic function panel -     TSH -     BASIC METABOLIC PANEL WITH GFR -     Urinalysis, Routine w reflex microscopic -     CBC with Differential/Platelet  Gastroesophageal reflux disease without esophagitis- I recommended that he take a PPI to treat this. -     CBC with Differential/Platelet; Future -     esomeprazole (NEXIUM) 40 MG capsule; Take 1 capsule (40 mg total) by mouth daily. -     CBC with Differential/Platelet  Routine general medical examination at a health care facility- Exam completed, labs reviewed, vaccines reviewed and updated, patient education was given. -     Lipid panel; Future -     PSA; Future -     PSA -     Lipid panel  Vitamin D deficiency- His vitamin D level is normal now. -     VITAMIN D 25  Hydroxy (Vit-D Deficiency, Fractures); Future -     VITAMIN D 25 Hydroxy (Vit-D Deficiency, Fractures)  Need for hepatitis C screening test -     Hepatitis C antibody;  Future -     Hepatitis C antibody  Hyperlipidemia LDL goal <160- He does not have an elevated ASCVD risk score so I did not recommend a statin for CV risk reduction.   I am having Benjamim Incorvaia start on esomeprazole. I am also having him maintain his Cholecalciferol, indapamide, and carvedilol.  Meds ordered this encounter  Medications  . esomeprazole (NEXIUM) 40 MG capsule    Sig: Take 1 capsule (40 mg total) by mouth daily.    Dispense:  90 capsule    Refill:  1  . indapamide (LOZOL) 1.25 MG tablet    Sig: TAKE 1 TABLET(1.25 MG) BY MOUTH DAILY    Dispense:  90 tablet    Refill:  1  . carvedilol (COREG) 3.125 MG tablet    Sig: TAKE 1 TABLET(3.125 MG) BY MOUTH TWICE DAILY WITH A MEAL    Dispense:  180 tablet    Refill:  1     Follow-up: Return in about 3 months (around 07/24/2020).  Sanda Linger, MD

## 2020-04-23 NOTE — Patient Instructions (Signed)

## 2020-04-24 ENCOUNTER — Encounter: Payer: Self-pay | Admitting: Internal Medicine

## 2020-04-24 LAB — PSA: PSA: 0.4 ng/mL (ref <=4.0)

## 2020-04-24 LAB — URINALYSIS, ROUTINE W REFLEX MICROSCOPIC
Bilirubin Urine: NEGATIVE
Glucose, UA: NEGATIVE
Hgb urine dipstick: NEGATIVE
Ketones, ur: NEGATIVE
Leukocytes,Ua: NEGATIVE
Nitrite: NEGATIVE
Protein, ur: NEGATIVE
Specific Gravity, Urine: 1.023 (ref 1.001–1.03)
pH: 5.5 (ref 5.0–8.0)

## 2020-04-24 LAB — CBC WITH DIFFERENTIAL/PLATELET
Absolute Monocytes: 431 cells/uL (ref 200–950)
Basophils Absolute: 37 cells/uL (ref 0–200)
Basophils Relative: 0.5 %
Eosinophils Absolute: 197 cells/uL (ref 15–500)
Eosinophils Relative: 2.7 %
HCT: 44.2 % (ref 38.5–50.0)
Hemoglobin: 14.5 g/dL (ref 13.2–17.1)
Lymphs Abs: 2957 cells/uL (ref 850–3900)
MCH: 28.6 pg (ref 27.0–33.0)
MCHC: 32.8 g/dL (ref 32.0–36.0)
MCV: 87.2 fL (ref 80.0–100.0)
MPV: 10.8 fL (ref 7.5–12.5)
Monocytes Relative: 5.9 %
Neutro Abs: 3679 cells/uL (ref 1500–7800)
Neutrophils Relative %: 50.4 %
Platelets: 269 10*3/uL (ref 140–400)
RBC: 5.07 10*6/uL (ref 4.20–5.80)
RDW: 13.3 % (ref 11.0–15.0)
Total Lymphocyte: 40.5 %
WBC: 7.3 10*3/uL (ref 3.8–10.8)

## 2020-04-24 LAB — HEPATIC FUNCTION PANEL
AG Ratio: 1.2 (calc) (ref 1.0–2.5)
ALT: 15 U/L (ref 9–46)
AST: 20 U/L (ref 10–40)
Albumin: 4.2 g/dL (ref 3.6–5.1)
Alkaline phosphatase (APISO): 65 U/L (ref 36–130)
Bilirubin, Direct: 0.1 mg/dL (ref 0.0–0.2)
Globulin: 3.4 g/dL (calc) (ref 1.9–3.7)
Indirect Bilirubin: 0.2 mg/dL (calc) (ref 0.2–1.2)
Total Bilirubin: 0.3 mg/dL (ref 0.2–1.2)
Total Protein: 7.6 g/dL (ref 6.1–8.1)

## 2020-04-24 LAB — BASIC METABOLIC PANEL WITH GFR
BUN: 17 mg/dL (ref 7–25)
CO2: 26 mmol/L (ref 20–32)
Calcium: 9.5 mg/dL (ref 8.6–10.3)
Chloride: 100 mmol/L (ref 98–110)
Creat: 1.23 mg/dL (ref 0.60–1.35)
GFR, Est African American: 83 mL/min/{1.73_m2} (ref >=60)
GFR, Est Non African American: 72 mL/min/{1.73_m2} (ref >=60)
Glucose, Bld: 88 mg/dL (ref 65–99)
Potassium: 4.2 mmol/L (ref 3.5–5.3)
Sodium: 138 mmol/L (ref 135–146)

## 2020-04-24 LAB — LIPID PANEL
Cholesterol: 227 mg/dL — ABNORMAL HIGH (ref <200)
HDL: 40 mg/dL (ref >=40)
LDL Cholesterol (Calc): 148 mg/dL (calc) — ABNORMAL HIGH
Non-HDL Cholesterol (Calc): 187 mg/dL (calc) — ABNORMAL HIGH (ref <130)
Total CHOL/HDL Ratio: 5.7 (calc) — ABNORMAL HIGH (ref <5.0)
Triglycerides: 252 mg/dL — ABNORMAL HIGH (ref <150)

## 2020-04-24 LAB — HEPATITIS C ANTIBODY
Hepatitis C Ab: NONREACTIVE
SIGNAL TO CUT-OFF: 0.02 (ref <1.00)

## 2020-04-24 LAB — TSH: TSH: 1.47 mIU/L (ref 0.40–4.50)

## 2020-04-24 LAB — VITAMIN D 25 HYDROXY (VIT D DEFICIENCY, FRACTURES): Vit D, 25-Hydroxy: 31 ng/mL (ref 30–100)

## 2020-07-19 ENCOUNTER — Encounter: Payer: Self-pay | Admitting: Internal Medicine

## 2020-07-23 ENCOUNTER — Emergency Department (HOSPITAL_COMMUNITY)
Admission: EM | Admit: 2020-07-23 | Discharge: 2020-07-23 | Disposition: A | Discharge status: Home / Self Care | Payer: BC Managed Care – PPO | Attending: Internal Medicine | Admitting: Internal Medicine

## 2020-07-23 ENCOUNTER — Ambulatory Visit (INDEPENDENT_AMBULATORY_CARE_PROVIDER_SITE_OTHER): Payer: BC Managed Care – PPO | Admitting: Internal Medicine

## 2020-07-23 ENCOUNTER — Encounter: Payer: Self-pay | Admitting: Internal Medicine

## 2020-07-23 ENCOUNTER — Emergency Department (HOSPITAL_COMMUNITY)
Admission: EM | Admit: 2020-07-23 | Discharge: 2020-07-23 | Discharge status: Absent without leave | Payer: BC Managed Care – PPO | Attending: Internal Medicine | Admitting: Internal Medicine

## 2020-07-23 ENCOUNTER — Other Ambulatory Visit: Payer: Self-pay

## 2020-07-23 VITALS — BP 128/84 | HR 75 | Temp 98.2°F | Resp 16 | Ht 71.0 in | Wt 246.0 lb

## 2020-07-23 DIAGNOSIS — R1013 Epigastric pain: Secondary | ICD-10-CM | POA: Insufficient documentation

## 2020-07-23 DIAGNOSIS — I10 Essential (primary) hypertension: Secondary | ICD-10-CM | POA: Diagnosis not present

## 2020-07-23 NOTE — Progress Notes (Signed)
Subjective:  Patient ID: Terry [name], male    DOB: Feb 04, 1976  Age: 44 y.o. MRN: [id number]  CC: Abdominal Pain  This visit occurred during the SARS-CoV-2 public health emergency.  Safety protocols were in place, including screening questions prior to the visit, additional usage of staff PPE, and extensive cleaning of exam room while observing appropriate contact time as indicated for disinfecting solutions.    HPI Terry [name] presents for the complaint of abdominal pain.  He tells me that about 6 days ago he felt like something burst in his epigastric region.  He left an email a few days ago and said the pain was unbearable.  He had several days of nausea and vomiting.  He has not vomited for 3 days now.  He has a good appetite.  He tells me the pain gets better after he eats.  He has had some stools that were dark but he denies bright red blood per rectum.  He denies cough, fever, chills, dysuria, or hematuria.  He tells me he is taking the PPI.  Outpatient Medications Prior to Visit  Medication Sig Dispense Refill  . carvedilol (COREG) 3.125 MG tablet TAKE 1 TABLET(3.125 MG) BY MOUTH TWICE DAILY WITH A MEAL 180 tablet 1  . Cholecalciferol 2000 units TABS Take 2 tablets (4,000 Units total) by mouth daily. 180 tablet 1  . esomeprazole (NEXIUM) 40 MG capsule Take 1 capsule (40 mg total) by mouth daily. 90 capsule 1  . indapamide (LOZOL) 1.25 MG tablet TAKE 1 TABLET(1.25 MG) BY MOUTH DAILY 90 tablet 1   No facility-administered medications prior to visit.    ROS Review of Systems  Constitutional: Negative.  Negative for appetite change, diaphoresis, fatigue and fever.  HENT: Negative.  Negative for sore throat, trouble swallowing and voice change.   Eyes: Negative.   Respiratory: Negative for cough, chest tightness, shortness of breath and wheezing.   Cardiovascular: Negative for chest pain, palpitations and leg swelling.  Gastrointestinal: Positive for abdominal pain,  nausea and vomiting. Negative for abdominal distention, blood in stool, constipation, diarrhea and rectal pain.  Endocrine: Negative.   Genitourinary: Negative.  Negative for difficulty urinating, dysuria, flank pain, frequency, hematuria, testicular pain and urgency.  Musculoskeletal: Negative.  Negative for back pain, myalgias and neck pain.  Skin: Negative.   Neurological: Negative.  Negative for dizziness, weakness, light-headedness, numbness and headaches.  Hematological: Negative for adenopathy. Does not bruise/bleed easily.  Psychiatric/Behavioral: Negative.     Objective:  BP 128/84   Pulse 75   Temp 98.2 F (36.8 C) (Oral)   Resp 16   Ht 5\' 11"  (1.803 m)   Wt 246 lb (111.6 kg)   SpO2 98%   BMI 34.31 kg/m   BP Readings from Last 3 Encounters:  07/23/20 128/84  04/23/20 (!) 162/98  07/25/19 138/88    Wt Readings from Last 3 Encounters:  07/23/20 246 lb (111.6 kg)  04/23/20 248 lb (112.5 kg)  07/25/19 233 lb 4 oz (105.8 kg)    Physical Exam Vitals reviewed.  Constitutional:      General: He is not in acute distress.    Appearance: He is well-developed. He is not ill-appearing, toxic-appearing or diaphoretic.  HENT:     Mouth/Throat:     Mouth: Mucous membranes are moist.     Pharynx: No oropharyngeal exudate.  Eyes:     General: No scleral icterus.    Conjunctiva/sclera: Conjunctivae normal.  Cardiovascular:     Rate and Rhythm: Normal rate  and regular rhythm.     Heart sounds: No murmur heard.   Pulmonary:     Effort: Pulmonary effort is normal.     Breath sounds: No stridor. No wheezing, rhonchi or rales.  Abdominal:     General: Abdomen is flat. Bowel sounds are normal. There is no distension.     Palpations: Abdomen is soft. There is no hepatomegaly, splenomegaly or mass.     Tenderness: There is no abdominal tenderness.  Genitourinary:    Prostate: Normal. Not enlarged, not tender and no nodules present.     Rectum: Normal. Guaiac result negative.  No mass, tenderness, anal fissure, external hemorrhoid or internal hemorrhoid. Normal anal tone.  Musculoskeletal:        General: Normal range of motion.     Cervical back: Neck supple.     Right lower leg: No edema.     Left lower leg: No edema.  Lymphadenopathy:     Cervical: No cervical adenopathy.  Skin:    General: Skin is warm and dry.  Neurological:     Mental Status: He is alert.     Lab Results  Component Value Date   WBC 7.3 04/23/2020   HGB 14.5 04/23/2020   HCT 44.2 04/23/2020   PLT 269 04/23/2020   GLUCOSE 88 04/23/2020   CHOL 227 (H) 04/23/2020   TRIG 252 (H) 04/23/2020   HDL 40 04/23/2020   LDLDIRECT 135.0 04/20/2019   LDLCALC 148 (H) 04/23/2020   ALT 15 04/23/2020   AST 20 04/23/2020   NA 138 04/23/2020   K 4.2 04/23/2020   CL 100 04/23/2020   CREATININE 1.23 04/23/2020   BUN 17 04/23/2020   CO2 26 04/23/2020   TSH 1.47 04/23/2020   PSA 0.4 04/23/2020    DG Chest 2 View  Result Date: 03/27/2014 CLINICAL DATA:  Cough, fever, congestion and rash. EXAM: CHEST - 2 VIEW COMPARISON:  09/30/2007 FINDINGS: The heart size and mediastinal contours are within normal limits. There is no evidence of pulmonary edema, consolidation, pneumothorax, nodule or pleural fluid. The visualized skeletal structures are unremarkable. IMPRESSION: No active disease. Electronically Signed   By: Irish Lack M.D.   On: 03/27/2014 14:20    Assessment & Plan:   Frederik was seen today for abdominal pain.  Diagnoses and all orders for this visit:  Epigastric pain- He has had a 6-day history of abdominal pain with intermittent nausea and vomiting.  His examination is not consistent with an acute abdominal process.  I had recommended labs and x-rays of his abdomen.  I was later informed that we are not able to do labs or x-ray today.  He has described the abdominal pain as unbearable and says he feels like something has burst.  If this indeed is his concern then he will have to go to  the emergency department to have this urgently evaluated. -     DG ABD ACUTE 2+V W 1V CHEST; Future -     CBC with Differential/Platelet; Future -     Lipase; Future -     Amylase; Future -     Hepatic function panel; Future  Essential hypertension- His blood pressure is adequately well controlled. -     Basic metabolic panel; Future   I am having Quenten Capers maintain his Cholecalciferol, esomeprazole, indapamide, and carvedilol.  No orders of the defined types were placed in this encounter.    Follow-up: Return in about 1 week (around 07/30/2020).  Sanda Linger, MD

## 2020-07-23 NOTE — Patient Instructions (Signed)
Abdominal Pain, Adult Pain in the abdomen (abdominal pain) can be caused by many things. Often, abdominal pain is not serious and it gets better with no treatment or by being treated at home. However, sometimes abdominal pain is serious. Your health care provider will ask questions about your medical history and do a physical exam to try to determine the cause of your abdominal pain. Follow these instructions at home:  Medicines  Take over-the-counter and prescription medicines only as told by your health care provider.  Do not take a laxative unless told by your health care provider. General instructions  Watch your condition for any changes.  Drink enough fluid to keep your urine pale yellow.  Keep all follow-up visits as told by your health care provider. This is important. Contact a health care provider if:  Your abdominal pain changes or gets worse.  You are not hungry or you lose weight without trying.  You are constipated or have diarrhea for more than 2-3 days.  You have pain when you urinate or have a bowel movement.  Your abdominal pain wakes you up at night.  Your pain gets worse with meals, after eating, or with certain foods.  You are vomiting and cannot keep anything down.  You have a fever.  You have blood in your urine. Get help right away if:  Your pain does not go away as soon as your health care provider told you to expect.  You cannot stop vomiting.  Your pain is only in areas of the abdomen, such as the right side or the left lower portion of the abdomen. Pain on the right side could be caused by appendicitis.  You have bloody or black stools, or stools that look like tar.  You have severe pain, cramping, or bloating in your abdomen.  You have signs of dehydration, such as: ? Dark urine, very little urine, or no urine. ? Cracked lips. ? Dry mouth. ? Sunken eyes. ? Sleepiness. ? Weakness.  You have trouble breathing or chest  pain. Summary  Often, abdominal pain is not serious and it gets better with no treatment or by being treated at home. However, sometimes abdominal pain is serious.  Watch your condition for any changes.  Take over-the-counter and prescription medicines only as told by your health care provider.  Contact a health care provider if your abdominal pain changes or gets worse.  Get help right away if you have severe pain, cramping, or bloating in your abdomen. This information is not intended to replace advice given to you by your health care provider. Make sure you discuss any questions you have with your health care provider. Document Revised: 01/17/2019 Document Reviewed: 01/17/2019 Elsevier Patient Education  2020 [employer]  

## 2020-07-24 ENCOUNTER — Encounter: Payer: Self-pay | Admitting: Internal Medicine

## 2020-07-24 ENCOUNTER — Other Ambulatory Visit (INDEPENDENT_AMBULATORY_CARE_PROVIDER_SITE_OTHER): Payer: BC Managed Care – PPO

## 2020-07-24 DIAGNOSIS — R1013 Epigastric pain: Secondary | ICD-10-CM | POA: Diagnosis not present

## 2020-07-24 DIAGNOSIS — I10 Essential (primary) hypertension: Secondary | ICD-10-CM | POA: Diagnosis not present

## 2020-07-24 LAB — CBC WITH DIFFERENTIAL/PLATELET
Basophils Absolute: 0 10*3/uL (ref 0.0–0.1)
Basophils Relative: 0.6 % (ref 0.0–3.0)
Eosinophils Absolute: 0.1 10*3/uL (ref 0.0–0.7)
Eosinophils Relative: 1.2 % (ref 0.0–5.0)
HCT: 35.7 % — ABNORMAL LOW (ref 39.0–52.0)
Hemoglobin: 11.9 g/dL — ABNORMAL LOW (ref 13.0–17.0)
Lymphocytes Relative: 36.6 % (ref 12.0–46.0)
Lymphs Abs: 2.7 10*3/uL (ref 0.7–4.0)
MCHC: 33.4 g/dL (ref 30.0–36.0)
MCV: 85.9 fl (ref 78.0–100.0)
Monocytes Absolute: 0.4 10*3/uL (ref 0.1–1.0)
Monocytes Relative: 5.4 % (ref 3.0–12.0)
Neutro Abs: 4.1 10*3/uL (ref 1.4–7.7)
Neutrophils Relative %: 56.2 % (ref 43.0–77.0)
Platelets: 359 10*3/uL (ref 150.0–400.0)
RBC: 4.16 Mil/uL — ABNORMAL LOW (ref 4.22–5.81)
RDW: 13.9 % (ref 11.5–15.5)
WBC: 7.3 10*3/uL (ref 4.0–10.5)

## 2020-07-24 LAB — HEPATIC FUNCTION PANEL
ALT: 15 U/L (ref 0–53)
AST: 16 U/L (ref 0–37)
Albumin: 4.4 g/dL (ref 3.5–5.2)
Alkaline Phosphatase: 65 U/L (ref 39–117)
Bilirubin, Direct: 0.1 mg/dL (ref 0.0–0.3)
Total Bilirubin: 0.3 mg/dL (ref 0.2–1.2)
Total Protein: 7.8 g/dL (ref 6.0–8.3)

## 2020-07-24 LAB — BASIC METABOLIC PANEL
BUN: 11 mg/dL (ref 6–23)
CO2: 31 mEq/L (ref 19–32)
Calcium: 9 mg/dL (ref 8.4–10.5)
Chloride: 101 mEq/L (ref 96–112)
Creatinine, Ser: 1.18 mg/dL (ref 0.40–1.50)
GFR: 76.09 mL/min (ref >60.00)
Glucose, Bld: 101 mg/dL — ABNORMAL HIGH (ref 70–99)
Potassium: 3.9 mEq/L (ref 3.5–5.1)
Sodium: 138 mEq/L (ref 135–145)

## 2020-07-24 LAB — LIPASE: Lipase: 41 U/L (ref 11.0–59.0)

## 2020-07-24 LAB — AMYLASE: Amylase: 40 U/L (ref 27–131)

## 2020-07-25 ENCOUNTER — Other Ambulatory Visit: Payer: Self-pay | Admitting: Internal Medicine

## 2020-07-25 ENCOUNTER — Encounter: Payer: Self-pay | Admitting: Nurse Practitioner

## 2020-07-25 DIAGNOSIS — R1013 Epigastric pain: Secondary | ICD-10-CM

## 2020-07-25 DIAGNOSIS — K219 Gastro-esophageal reflux disease without esophagitis: Secondary | ICD-10-CM

## 2020-07-25 DIAGNOSIS — D649 Anemia, unspecified: Secondary | ICD-10-CM

## 2020-07-30 ENCOUNTER — Other Ambulatory Visit: Payer: Self-pay

## 2020-07-30 ENCOUNTER — Ambulatory Visit (INDEPENDENT_AMBULATORY_CARE_PROVIDER_SITE_OTHER): Payer: BC Managed Care – PPO | Admitting: Internal Medicine

## 2020-07-30 ENCOUNTER — Encounter: Payer: Self-pay | Admitting: Internal Medicine

## 2020-07-30 VITALS — BP 124/84 | HR 75 | Temp 99.1°F | Resp 16 | Ht 71.0 in | Wt 249.0 lb

## 2020-07-30 DIAGNOSIS — D500 Iron deficiency anemia secondary to blood loss (chronic): Secondary | ICD-10-CM

## 2020-07-30 DIAGNOSIS — D539 Nutritional anemia, unspecified: Secondary | ICD-10-CM

## 2020-07-30 DIAGNOSIS — E781 Pure hyperglyceridemia: Secondary | ICD-10-CM

## 2020-07-30 DIAGNOSIS — I10 Essential (primary) hypertension: Secondary | ICD-10-CM | POA: Diagnosis not present

## 2020-07-30 LAB — TRIGLYCERIDES: Triglycerides: 311 mg/dL — ABNORMAL HIGH (ref 0.0–149.0)

## 2020-07-30 LAB — CBC WITH DIFFERENTIAL/PLATELET
Basophils Absolute: 0.1 10*3/uL (ref 0.0–0.1)
Basophils Relative: 0.7 % (ref 0.0–3.0)
Eosinophils Absolute: 0.1 10*3/uL (ref 0.0–0.7)
Eosinophils Relative: 1.8 % (ref 0.0–5.0)
HCT: 36.4 % — ABNORMAL LOW (ref 39.0–52.0)
Hemoglobin: 12.3 g/dL — ABNORMAL LOW (ref 13.0–17.0)
Lymphocytes Relative: 42.2 % (ref 12.0–46.0)
Lymphs Abs: 3.2 10*3/uL (ref 0.7–4.0)
MCHC: 33.9 g/dL (ref 30.0–36.0)
MCV: 86.1 fl (ref 78.0–100.0)
Monocytes Absolute: 0.4 10*3/uL (ref 0.1–1.0)
Monocytes Relative: 5.3 % (ref 3.0–12.0)
Neutro Abs: 3.7 10*3/uL (ref 1.4–7.7)
Neutrophils Relative %: 50 % (ref 43.0–77.0)
Platelets: 322 10*3/uL (ref 150.0–400.0)
RBC: 4.23 Mil/uL (ref 4.22–5.81)
RDW: 13.9 % (ref 11.5–15.5)
WBC: 7.5 10*3/uL (ref 4.0–10.5)

## 2020-07-30 LAB — IRON: Iron: 38 ug/dL — ABNORMAL LOW (ref 42–165)

## 2020-07-30 NOTE — Patient Instructions (Signed)
Anemia  Anemia is a condition in which you do not have enough red blood cells or hemoglobin. Hemoglobin is a substance in red blood cells that carries oxygen. When you do not have enough red blood cells or hemoglobin (are anemic), your body cannot get enough oxygen and your organs may not work properly. As a result, you may feel very tired or have other problems. What are the causes? Common causes of anemia include:  Excessive bleeding. Anemia can be caused by excessive bleeding inside or outside the body, including bleeding from the intestine or from periods in women.  Poor nutrition.  Long-lasting (chronic) kidney, thyroid, and liver disease.  Bone marrow disorders.  Cancer and treatments for cancer.  HIV (human immunodeficiency virus) and AIDS (acquired immunodeficiency syndrome).  Treatments for HIV and AIDS.  Spleen problems.  Blood disorders.  Infections, medicines, and autoimmune disorders that destroy red blood cells. What are the signs or symptoms? Symptoms of this condition include:  Minor weakness.  Dizziness.  Headache.  Feeling heartbeats that are irregular or faster than normal (palpitations).  Shortness of breath, especially with exercise.  Paleness.  Cold sensitivity.  Indigestion.  Nausea.  Difficulty sleeping.  Difficulty concentrating. Symptoms may occur suddenly or develop slowly. If your anemia is mild, you may not have symptoms. How is this diagnosed? This condition is diagnosed based on:  Blood tests.  Your medical history.  A physical exam.  Bone marrow biopsy. Your health care provider may also check your stool (feces) for blood and may do additional testing to look for the cause of your bleeding. You may also have other tests, including:  Imaging tests, such as a CT scan or MRI.  Endoscopy.  Colonoscopy. How is this treated? Treatment for this condition depends on the cause. If you continue to lose a lot of blood, you may  need to be treated at a hospital. Treatment may include:  Taking supplements of iron, vitamin M08, or folic acid.  Taking a hormone medicine (erythropoietin) that can help to stimulate red blood cell growth.  Having a blood transfusion. This may be needed if you lose a lot of blood.  Making changes to your diet.  Having surgery to remove your spleen. Follow these instructions at home:  Take over-the-counter and prescription medicines only as told by your health care provider.  Take supplements only as told by your health care provider.  Follow any diet instructions that you were given.  Keep all follow-up visits as told by your health care provider. This is important. Contact a health care provider if:  You develop new bleeding anywhere in the body. Get help right away if:  You are very weak.  You are short of breath.  You have pain in your abdomen or chest.  You are dizzy or feel faint.  You have trouble concentrating.  You have bloody or black, tarry stools.  You vomit repeatedly or you vomit up blood. Summary  Anemia is a condition in which you do not have enough red blood cells or enough of a substance in your red blood cells that carries oxygen (hemoglobin).  Symptoms may occur suddenly or develop slowly.  If your anemia is mild, you may not have symptoms.  This condition is diagnosed with blood tests as well as a medical history and physical exam. Other tests may be needed.  Treatment for this condition depends on the cause of the anemia. This information is not intended to replace advice given to you by  your health care provider. Make sure you discuss any questions you have with your health care provider. Document Revised: 08/21/2017 Document Reviewed: 10/10/2016 Elsevier Patient Education  [place].

## 2020-07-30 NOTE — Progress Notes (Signed)
Subjective:  Patient ID: Terry [name], male    DOB: 1975/10/19  Age: 44 y.o. MRN: [id number]  CC: Anemia  This visit occurred during the SARS-CoV-2 public health emergency.  Safety protocols were in place, including screening questions prior to the visit, additional usage of staff PPE, and extensive cleaning of exam room while observing appropriate contact time as indicated for disinfecting solutions.    HPI Terry [name] presents for f/up - He tells me the abdominal pain has resolved. He is taking the PPI. He denies odynophagia, dysphagia, nausea, vomiting, bright red blood per rectum, or melena.  Outpatient Medications Prior to Visit  Medication Sig Dispense Refill  . carvedilol (COREG) 3.125 MG tablet TAKE 1 TABLET(3.125 MG) BY MOUTH TWICE DAILY WITH A MEAL 180 tablet 1  . Cholecalciferol 2000 units TABS Take 2 tablets (4,000 Units total) by mouth daily. 180 tablet 1  . esomeprazole (NEXIUM) 40 MG capsule Take 1 capsule (40 mg total) by mouth daily. 90 capsule 1  . indapamide (LOZOL) 1.25 MG tablet TAKE 1 TABLET(1.25 MG) BY MOUTH DAILY 90 tablet 1   No facility-administered medications prior to visit.    ROS Review of Systems  Constitutional: Positive for unexpected weight change (wt gain). Negative for diaphoresis and fatigue.  HENT: Negative.  Negative for trouble swallowing.   Eyes: Negative.   Respiratory: Negative for cough, chest tightness, shortness of breath and wheezing.   Cardiovascular: Negative for chest pain, palpitations and leg swelling.  Gastrointestinal: Negative for abdominal pain, blood in stool, constipation, diarrhea, nausea and vomiting.  Endocrine: Negative.   Genitourinary: Negative.  Negative for difficulty urinating, dysuria and hematuria.  Musculoskeletal: Negative.   Skin: Negative.  Negative for color change and pallor.  Neurological: Negative.  Negative for dizziness, weakness and light-headedness.  Hematological: Negative for  adenopathy. Does not bruise/bleed easily.  Psychiatric/Behavioral: Negative.     Objective:  BP 124/84   Pulse 75   Temp 99.1 F (37.3 C) (Oral)   Resp 16   Ht 5\' 11"  (1.803 m)   Wt 249 lb (112.9 kg)   SpO2 98%   BMI 34.73 kg/m   BP Readings from Last 3 Encounters:  07/30/20 124/84  07/23/20 128/84  04/23/20 (!) 162/98    Wt Readings from Last 3 Encounters:  07/30/20 249 lb (112.9 kg)  07/23/20 246 lb (111.6 kg)  04/23/20 248 lb (112.5 kg)    Physical Exam Vitals reviewed.  HENT:     Nose: Nose normal.     Mouth/Throat:     Mouth: Mucous membranes are moist.  Eyes:     General: No scleral icterus.    Conjunctiva/sclera: Conjunctivae normal.  Cardiovascular:     Rate and Rhythm: Normal rate and regular rhythm.     Heart sounds: No murmur heard.   Pulmonary:     Effort: Pulmonary effort is normal.     Breath sounds: No stridor. No wheezing, rhonchi or rales.  Abdominal:     General: Abdomen is flat. Bowel sounds are normal. There is no distension.     Palpations: Abdomen is soft. There is no hepatomegaly, splenomegaly or mass.     Tenderness: There is no abdominal tenderness.  Musculoskeletal:        General: Normal range of motion.     Cervical back: Neck supple.  Lymphadenopathy:     Cervical: No cervical adenopathy.  Skin:    General: Skin is warm.     Coloration: Skin is not pale.  Neurological:  General: No focal deficit present.     Mental Status: He is alert.  Psychiatric:        Mood and Affect: Mood normal.        Behavior: Behavior normal.     Lab Results  Component Value Date   WBC 7.5 07/30/2020   HGB 12.3 (L) 07/30/2020   HCT 36.4 (L) 07/30/2020   PLT 322.0 07/30/2020   GLUCOSE 101 (H) 07/24/2020   CHOL 227 (H) 04/23/2020   TRIG 311.0 (H) 07/30/2020   HDL 40 04/23/2020   LDLDIRECT 135.0 04/20/2019   LDLCALC 148 (H) 04/23/2020   ALT 15 07/24/2020   AST 16 07/24/2020   NA 138 07/24/2020   K 3.9 07/24/2020   CL 101 07/24/2020    CREATININE 1.18 07/24/2020   BUN 11 07/24/2020   CO2 31 07/24/2020   TSH 1.47 04/23/2020   PSA 0.4 04/23/2020    DG ABD ACUTE 2+V W 1V CHEST  Result Date: 07/23/2020 CLINICAL DATA:  Epigastric pain for 1 week. EXAM: DG ABDOMEN ACUTE WITH 1 VIEW CHEST COMPARISON:  Chest x-ray 09/30/2007 FINDINGS: There is no evidence of dilated bowel loops or findings to suggest free intraperitoneal air. No radiopaque calculi or other significant radiographic abnormality is seen. Slight elevation of left hemidiaphragm. The heart size and mediastinal contours are within normal limits. No focal consolidation. No pulmonary edema. No pleural effusion. No pneumothorax. No acute osseous abnormality. IMPRESSION: 1. Negative abdominal radiographs. 2. No acute cardiopulmonary disease. Electronically Signed   By: Tish Frederickson M.D.   On: 07/23/2020 17:21    Assessment & Plan:   Terry [name] was seen today for anemia.  Diagnoses and all orders for this visit:  Deficiency anemia- His H&H have improved. His iron level is normal. I will screen him for other vitamin deficiencies. I am concerned he may have an upper GI source of blood loss. He will see GI soon to consider upper endoscopy. -     Ferritin; Future -     Folate; Future -     CBC with Differential/Platelet; Future -     Iron; Future -     Vitamin B12; Future -     Vitamin B1; Future -     Vitamin B1 -     Vitamin B12 -     Iron -     CBC with Differential/Platelet -     Folate -     Ferritin  Essential hypertension- His blood pressure is adequately well controlled.  Primary hypertriglyceridemia- His triglycerides are mildly elevated but not high enough to warrant medical intervention. He agrees to work on his lifestyle modifications. -     Triglycerides; Future -     Triglycerides  Iron deficiency anemia due to chronic blood loss -     ferrous sulfate 220 (44 Fe) MG/5ML solution; Take 5 mLs (220 mg total) by mouth 3 (three) times daily with  meals. -     Ascorbic Acid (VITAMIN C) 100 MG tablet; Take 1 tablet (100 mg total) by mouth 3 (three) times daily with meals.   I am having Terry [name] start on ferrous sulfate and vitamin C. I am also having him maintain his Cholecalciferol, esomeprazole, indapamide, and carvedilol.  Meds ordered this encounter  Medications  . ferrous sulfate 220 (44 Fe) MG/5ML solution    Sig: Take 5 mLs (220 mg total) by mouth 3 (three) times daily with meals.    Dispense:  473 mL    Refill:  0  . Ascorbic Acid (VITAMIN C) 100 MG tablet    Sig: Take 1 tablet (100 mg total) by mouth 3 (three) times daily with meals.    Dispense:  270 tablet    Refill:  0     Follow-up: Return in about 3 months (around 10/30/2020).  Sanda Linger, MD

## 2020-07-31 DIAGNOSIS — D500 Iron deficiency anemia secondary to blood loss (chronic): Secondary | ICD-10-CM | POA: Insufficient documentation

## 2020-07-31 LAB — FOLATE: Folate: 16.3 ng/mL (ref >5.9)

## 2020-07-31 LAB — FERRITIN: Ferritin: 11.7 ng/mL — ABNORMAL LOW (ref 22.0–322.0)

## 2020-07-31 LAB — VITAMIN B12: Vitamin B-12: 655 pg/mL (ref 211–911)

## 2020-07-31 MED ORDER — VITAMIN C 100 MG PO TABS: 100.0000 mg | ORAL_TABLET | Freq: Three times a day (TID) | ORAL | 0 refills | Status: DC

## 2020-07-31 MED ORDER — FERROUS SULFATE 220 (44 FE) MG/5ML PO ELIX: 220.0000 mg | ORAL_SOLUTION | Freq: Three times a day (TID) | ORAL | 0 refills | Status: AC

## 2020-08-04 LAB — VITAMIN B1: Vitamin B1 (Thiamine): 8 nmol/L (ref 8–30)

## 2020-08-08 ENCOUNTER — Encounter: Payer: Self-pay | Admitting: Nurse Practitioner

## 2020-08-08 ENCOUNTER — Ambulatory Visit (INDEPENDENT_AMBULATORY_CARE_PROVIDER_SITE_OTHER): Payer: BC Managed Care – PPO | Admitting: Nurse Practitioner

## 2020-08-08 ENCOUNTER — Encounter: Payer: Self-pay | Admitting: Gastroenterology

## 2020-08-08 VITALS — BP 122/82 | HR 73 | Ht 71.0 in | Wt 251.4 lb

## 2020-08-08 DIAGNOSIS — D509 Iron deficiency anemia, unspecified: Secondary | ICD-10-CM | POA: Diagnosis not present

## 2020-08-08 DIAGNOSIS — R112 Nausea with vomiting, unspecified: Secondary | ICD-10-CM

## 2020-08-08 DIAGNOSIS — R1013 Epigastric pain: Secondary | ICD-10-CM | POA: Diagnosis not present

## 2020-08-08 NOTE — Patient Instructions (Addendum)
If you are age 42 or older, your body mass index should be between 23-30. Your Body mass index is 35.06 kg/m. If this is out of the aforementioned range listed, please consider follow up with your Primary Care Provider.  If you are age 15 or younger, your body mass index should be between 19-25. Your Body mass index is 35.06 kg/m. If this is out of the aformentioned range listed, please consider follow up with your Primary Care Provider.   Continue Nexium and Iron.    You have been scheduled for an endoscopy. Please follow written instructions given to you at your visit today. If you use inhalers (even only as needed), please bring them with you on the day of your procedure.  Due to recent changes in healthcare laws, you may see the results of your imaging and laboratory studies on MyChart before your provider has had a chance to review them.  We understand that in some cases there may be results that are confusing or concerning to you. Not all laboratory results come back in the same time frame and the provider may be waiting for multiple results in order to interpret others.  Please give [place] 48 hours in order for your provider to thoroughly review all the results before contacting the office for clarification of your results.

## 2020-08-08 NOTE — Progress Notes (Signed)
ASSESSMENT AND PLAN    # 44 year old male with new iron deficiency anemia in setting of recent acute on chronic upper abdominal pain / vomiting / dark stools.  Nausea / vomiting and dark stools resolved several days ago after starting Nexium.  Still has intermittent epigastric pain a couple times a week. Hgb is 12. Ferritin 11. 7 --No history of heavy NSAID use.  --Continue daily Nexium --For further evaluation patient will be scheduled for EGD. The risks and benefits of EGD were discussed and the patient agrees to proceed.  --If EGD negative for source of iron deficiency anemia then patient will need to be scheduled for colonoscopy   HISTORY OF PRESENT ILLNESS     Primary Gastroenterologist :  [name], MD  Chief Complaint : abdominal pain.   Terry [name] is a 44 y.o. male with PMH significant for HTN, hyperlipidemia  Patient is referred by PCP for evaluation of epigastric pain.  He was seen in 2013 and again in 2015 for epigastric burning.  When I saw him in 2015 patient had taken a month of a PPI and was already feeling better.  He was retreated with a 30-day course of PPI with plans to proceed with EGD if symptoms did not improve.  He had no alarm symptoms at the time His upper abdominal burning resolved and he did fine for years. A few weeks ago patient developed recurrent epigastric burning. He also had nausea / vomiting of non-bloody emesis and noticed dark stools. Evaluated by PCP, a 2 view of the abdomen xray was negative. Patient says PCP checked stool for blood and it was negative. Labs ordered -  Lipase and LFTs were normal but hemoglobin had dropped from 14.5 to 11.9. Patient started on oral iron and Nexium . Nausea / vomiting have since resolved. Still has epigastric burning a few times a week. Burning unrelated to eating. No dark stools in several days. Patient has no history of significant NSAID use. His weight is stable. No FMH of stomach or colon cancer    Data Reviewed:  04/23/2020 Normal TSH Normal LFTs , normal lipase  07/23/20 2 view abdomen - negative  07/30/20 Hemoglobin 12.3, MCV 86, ferritin 11.7.   Previous Endoscopic Evaluations / Pertinent Studies:  none  Past Medical History:  Diagnosis Date  . GERD (gastroesophageal reflux disease)      Past Surgical History:  Procedure Laterality Date  . NO PAST SURGERIES     Family History  Problem Relation Age of Onset  . Hypertension Mother   . Hypertension Father   . Esophageal cancer Neg Hx   . Colon cancer Neg Hx   . Stomach cancer Neg Hx   . Prostate cancer Neg Hx    Social History   Tobacco Use  . Smoking status: Never Smoker  . Smokeless tobacco: Never Used  Vaping Use  . Vaping Use: Never used  Substance Use Topics  . Alcohol use: Yes    Comment: 1 drink per day  . Drug use: No   Current Outpatient Medications  Medication Sig Dispense Refill  . carvedilol (COREG) 3.125 MG tablet TAKE 1 TABLET(3.125 MG) BY MOUTH TWICE DAILY WITH A MEAL 180 tablet 1  . Cholecalciferol 2000 units TABS Take 2 tablets (4,000 Units total) by mouth daily. 180 tablet 1  . esomeprazole (NEXIUM) 40 MG capsule Take 1 capsule (40 mg total) by mouth daily. 90 capsule 1  . ferrous sulfate 220 (44 Fe) MG/5ML solution Take  5 mLs (220 mg total) by mouth 3 (three) times daily with meals. 473 mL 0  . indapamide (LOZOL) 1.25 MG tablet TAKE 1 TABLET(1.25 MG) BY MOUTH DAILY 90 tablet 1   No current facility-administered medications for this visit.   No Known Allergies   Review of Systems: All systems reviewed and negative except where noted in HPI.   PHYSICAL EXAM :    Wt Readings from Last 3 Encounters:  08/08/20 251 lb 6 oz (114 kg)  07/30/20 249 lb (112.9 kg)  07/23/20 246 lb (111.6 kg)    BP 122/82 (BP Location: Left Arm, Patient Position: Sitting, Cuff Size: Normal)   Pulse 73   Ht 5\' 11"  (1.803 m)   Wt 251 lb 6 oz (114 kg)   BMI 35.06 kg/m  Constitutional:  Pleasant  male in no acute distress. Psychiatric: Normal mood and affect. Behavior is normal. EENT: Pupils normal.  Conjunctivae are normal. No scleral icterus. Neck supple.  Cardiovascular: Normal rate, regular rhythm. No edema Pulmonary/chest: Effort normal and breath sounds normal. No wheezing, rales or rhonchi. Abdominal: Soft, nondistended, nontender. Bowel sounds active throughout. There are no masses palpable. No hepatomegaly. Neurological: Alert and oriented to person place and time. Skin: Skin is warm and dry. No rashes noted.  , NP  08/08/2020, 9:40 AM  Cc:  Referring Provider 08/10/2020, MD

## 2020-08-08 NOTE — Progress Notes (Signed)
Reviewed and agree with management plan.  Tessy Pawelski T. Viney Acocella, MD FACG [facility]  

## 2020-08-13 ENCOUNTER — Ambulatory Visit (AMBULATORY_SURGERY_CENTER): Payer: BC Managed Care – PPO | Admitting: Gastroenterology

## 2020-08-13 ENCOUNTER — Encounter: Payer: Self-pay | Admitting: Gastroenterology

## 2020-08-13 ENCOUNTER — Other Ambulatory Visit: Payer: Self-pay | Admitting: Gastroenterology

## 2020-08-13 ENCOUNTER — Other Ambulatory Visit: Payer: Self-pay

## 2020-08-13 VITALS — BP 169/111 | HR 55 | Temp 97.8°F | Resp 38 | Ht 71.0 in | Wt 251.0 lb

## 2020-08-13 DIAGNOSIS — K269 Duodenal ulcer, unspecified as acute or chronic, without hemorrhage or perforation: Secondary | ICD-10-CM

## 2020-08-13 DIAGNOSIS — B9681 Helicobacter pylori [H. pylori] as the cause of diseases classified elsewhere: Secondary | ICD-10-CM

## 2020-08-13 DIAGNOSIS — D509 Iron deficiency anemia, unspecified: Secondary | ICD-10-CM | POA: Diagnosis not present

## 2020-08-13 DIAGNOSIS — K2970 Gastritis, unspecified, without bleeding: Secondary | ICD-10-CM

## 2020-08-13 DIAGNOSIS — R1013 Epigastric pain: Secondary | ICD-10-CM

## 2020-08-13 DIAGNOSIS — K3189 Other diseases of stomach and duodenum: Secondary | ICD-10-CM | POA: Diagnosis not present

## 2020-08-13 DIAGNOSIS — K2950 Unspecified chronic gastritis without bleeding: Secondary | ICD-10-CM | POA: Diagnosis not present

## 2020-08-13 MED ORDER — SODIUM CHLORIDE 0.9 % IV SOLN: 500.0000 mL | Freq: Once | INTRAVENOUS | Status: DC

## 2020-08-13 NOTE — Progress Notes (Signed)
Pt's states no medical or surgical changes since previsit or office visit. 

## 2020-08-13 NOTE — Patient Instructions (Addendum)
Handouts given for Gastritis and Peptic ulcer disease.  Office will schedule return appt with Willette Cluster NP or Dr. Russella Dar.  Await  Pathology results.  No Aspirin, Ibuprofen, naproxen or other NSAIDS long term to avoid ulcers.   YOU HAD AN ENDOSCOPIC PROCEDURE TODAY AT THE [facility]:   Refer to the procedure report that was given to you for any specific questions about what was found during the examination.  If the procedure report does not answer your questions, please call your gastroenterologist to clarify.  If you requested that your care partner not be given the details of your procedure findings, then the procedure report has been included in a sealed envelope for you to review at your convenience later.  YOU SHOULD EXPECT: Some feelings of bloating in the abdomen. Passage of more gas than usual.  Walking can help get rid of the air that was put into your GI tract during the procedure and reduce the bloating. If you had a lower endoscopy (such as a colonoscopy or flexible sigmoidoscopy) you may notice spotting of blood in your stool or on the toilet paper. If you underwent a bowel prep for your procedure, you may not have a normal bowel movement for a few days.  Please Note:  You might notice some irritation and congestion in your nose or some drainage.  This is from the oxygen used during your procedure.  There is no need for concern and it should clear up in a day or so.  SYMPTOMS TO REPORT IMMEDIATELY:   Following upper endoscopy (EGD)  Vomiting of blood or coffee ground material  New chest pain or pain under the shoulder blades  Painful or persistently difficult swallowing  New shortness of breath  Fever of 100F or higher  Black, tarry-looking stools  For urgent or emergent issues, a gastroenterologist can be reached at any hour by calling [phone]. Do not use MyChart messaging for urgent concerns.    DIET:  We do recommend a small meal at first, but  then you may proceed to your regular diet.  Drink plenty of fluids but you should avoid alcoholic beverages for 24 hours.  ACTIVITY:  You should plan to take it easy for the rest of today and you should NOT DRIVE or use heavy machinery until tomorrow (because of the sedation medicines used during the test).    FOLLOW UP: Our staff will call the number listed on your records 48-72 hours following your procedure to check on you and address any questions or concerns that you may have regarding the information given to you following your procedure. If we do not reach you, we will leave a message.  We will attempt to reach you two times.  During this call, we will ask if you have developed any symptoms of COVID 19. If you develop any symptoms (ie: fever, flu-like symptoms, shortness of breath, cough etc.) before then, please call [phone].  If you test positive for Covid 19 in the 2 weeks post procedure, please call and report this information to [place].    If any biopsies were taken you will be contacted by phone or by letter within the next 1-3 weeks.  Please call us at [phone] if you have not heard about the biopsies in 3 weeks.    SIGNATURES/CONFIDENTIALITY: You and/or your care partner have signed paperwork which will be entered into your electronic medical record.  These signatures attest to the fact that that the information above  on your After Visit Summary has been reviewed and is understood.  Full responsibility of the confidentiality of this discharge information lies with you and/or your care-partner.

## 2020-08-13 NOTE — Progress Notes (Signed)
C.W. vital signs. 

## 2020-08-13 NOTE — Op Note (Signed)
[facility] Patient Name: Terry [name] Procedure Date: 08/13/2020 3:57 PM MRN: [id number] Endoscopist: Meryl Dare , MD Age: 44 Referring MD:  Date of Birth: [birth year] Gender: Male Account #: [id number] Procedure:                Upper GI endoscopy Indications:              Epigastric abdominal pain, Unexplained iron                            deficiency anemia Medicines:                Monitored Anesthesia Care Procedure:                Pre-Anesthesia Assessment:                           - Prior to the procedure, a History and Physical                            was performed, and patient medications and                            allergies were reviewed. The patient's tolerance of                            previous anesthesia was also reviewed. The risks                            and benefits of the procedure and the sedation                            options and risks were discussed with the patient.                            All questions were answered, and informed consent                            was obtained. Prior Anticoagulants: The patient has                            taken no previous anticoagulant or antiplatelet                            agents. ASA Grade Assessment: II - A patient with                            mild systemic disease. After reviewing the risks                            and benefits, the patient was deemed in                            satisfactory condition to undergo the procedure.  After obtaining informed consent, the endoscope was                            passed under direct vision. Throughout the                            procedure, the patient's blood pressure, pulse, and                            oxygen saturations were monitored continuously. The                            Endoscope was introduced through the mouth, and                            advanced to the second part of  duodenum. The upper                            GI endoscopy was accomplished without difficulty.                            The patient tolerated the procedure well. Scope In: Scope Out: Findings:                 The examined esophagus was normal.                           Patchy mildly erythematous mucosa without bleeding                            was found in the entire examined stomach. Biopsies                            were taken with a cold forceps for histology.                           The exam of the stomach was otherwise normal.                           One non-bleeding superficial duodenal ulcer with no                            stigmata of bleeding was found in the duodenal                            bulb. The lesion was 7 mm in largest dimension.                           A moderate post-ulcer deformity with scarring,                            edema, stenosis was found in the duodenal bulb.  Otherwise the duodenal bulb and second portion of                            the duodenum were normal. Biopsies for histology                            were taken with a cold forceps for evaluation of                            celiac disease. Complications:            No immediate complications. Estimated Blood Loss:     Estimated blood loss was minimal. Impression:               - Normal esophagus.                           - Erythematous mucosa in the stomach. Biopsied.                           - Non-bleeding duodenal ulcer with no stigmata of                            bleeding.                           - Duodenal bulb deformity with scarring, edema,                            stenosis.                           - Otherwise normal duodenal bulb and second portion                            of the duodenum. Biopsied. Recommendation:           - Await pathology results.                           - Patient has a contact number available for                             emergencies. The signs and symptoms of potential                            delayed complications were discussed with the                            patient. Return to normal activities tomorrow.                            Written discharge instructions were provided to the                            patient.                           -  Resume previous diet.                           - Continue present medications, including Nexium 40                            mg po qd.                           - Await pathology results.                           - No aspirin, ibuprofen, naproxen, or other                            non-steroidal anti-inflammatory drugs long term.                           - Consider scheduling colonoscopy at return office                            visits pending symptom response and response to                            iron.                           - Return to GI office in 6 weeks with Willette Cluster, NP-C or me. Meryl Dare, MD 08/13/2020 4:22:07 PM This report has been signed electronically.

## 2020-08-13 NOTE — Progress Notes (Signed)
Called to room to assist during endoscopic procedure.  Patient ID and intended procedure confirmed with present staff. Received instructions for my participation in the procedure from the performing physician.  

## 2020-08-13 NOTE — Progress Notes (Signed)
Report to PACU, RN, vss, BBS= Clear.  

## 2020-08-14 ENCOUNTER — Telehealth: Payer: Self-pay

## 2020-08-14 NOTE — Telephone Encounter (Signed)
Unable to leave message mailbox is full.

## 2020-08-22 ENCOUNTER — Encounter: Payer: Self-pay | Admitting: Internal Medicine

## 2020-08-22 ENCOUNTER — Ambulatory Visit (INDEPENDENT_AMBULATORY_CARE_PROVIDER_SITE_OTHER): Payer: BC Managed Care – PPO | Admitting: Internal Medicine

## 2020-08-22 ENCOUNTER — Other Ambulatory Visit: Payer: Self-pay

## 2020-08-22 VITALS — BP 138/88 | HR 64 | Temp 98.7°F | Resp 16 | Ht 71.0 in | Wt 252.0 lb

## 2020-08-22 DIAGNOSIS — L259 Unspecified contact dermatitis, unspecified cause: Secondary | ICD-10-CM

## 2020-08-22 MED ORDER — DESONIDE 0.05 % EX LOTN: TOPICAL_LOTION | Freq: Two times a day (BID) | CUTANEOUS | 0 refills | Status: AC

## 2020-08-22 MED ORDER — HYDROXYZINE HCL 25 MG PO TABS: 25.0000 mg | ORAL_TABLET | Freq: Three times a day (TID) | ORAL | 0 refills | Status: AC | PRN

## 2020-08-22 MED ORDER — METHYLPREDNISOLONE ACETATE 40 MG/ML IJ SUSP
40.0000 mg | Freq: Once | INTRAMUSCULAR | Status: AC
  Administered 2020-08-22: 40 mg via INTRAMUSCULAR

## 2020-08-22 MED ORDER — METHYLPREDNISOLONE ACETATE 80 MG/ML IJ SUSP
80.0000 mg | Freq: Once | INTRAMUSCULAR | Status: AC
  Administered 2020-08-22: 80 mg via INTRAMUSCULAR

## 2020-08-22 NOTE — Progress Notes (Signed)
Subjective:  Patient ID: Terry [name], male    DOB: 1975/11/30  Age: 44 y.o. MRN: [id number]  CC: Rash  This visit occurred during the SARS-CoV-2 public health emergency.  Safety protocols were in place, including screening questions prior to the visit, additional usage of staff PPE, and extensive cleaning of exam room while observing appropriate contact time as indicated for disinfecting solutions.    HPI Cheng Dec presents for the complaint of a 3-day history of itching and swelling over both outer ears.  He tells me that 1 day before the symptoms started he had been biking outdoors and was wearing a cloth ski mask over his face and ears.  He has not gotten much symptom relief with Benadryl.  Outpatient Medications Prior to Visit  Medication Sig Dispense Refill  . carvedilol (COREG) 3.125 MG tablet TAKE 1 TABLET(3.125 MG) BY MOUTH TWICE DAILY WITH A MEAL 180 tablet 1  . Cholecalciferol 2000 units TABS Take 2 tablets (4,000 Units total) by mouth daily. 180 tablet 1  . esomeprazole (NEXIUM) 40 MG capsule Take 1 capsule (40 mg total) by mouth daily. 90 capsule 1  . ferrous sulfate 220 (44 Fe) MG/5ML solution Take 5 mLs (220 mg total) by mouth 3 (three) times daily with meals. 473 mL 0  . indapamide (LOZOL) 1.25 MG tablet TAKE 1 TABLET(1.25 MG) BY MOUTH DAILY 90 tablet 1   No facility-administered medications prior to visit.    ROS Review of Systems  Constitutional: Negative.  Negative for chills and fever.  HENT: Negative.  Negative for facial swelling and trouble swallowing.   Eyes: Negative for visual disturbance.  Respiratory: Negative for cough, chest tightness, shortness of breath and wheezing.   Cardiovascular: Negative for chest pain, palpitations and leg swelling.  Gastrointestinal: Negative for abdominal pain, diarrhea, nausea and vomiting.  Endocrine: Negative.   Genitourinary: Negative.  Negative for difficulty urinating.  Musculoskeletal: Negative.   Negative for arthralgias and myalgias.  Skin: Positive for rash. Negative for color change.  Neurological: Negative.  Negative for dizziness, weakness, light-headedness and headaches.  Hematological: Negative for adenopathy. Does not bruise/bleed easily.  Psychiatric/Behavioral: Negative.     Objective:  BP 138/88   Pulse 64   Temp 98.7 F (37.1 C) (Oral)   Resp 16   Ht 5\' 11"  (1.803 m)   Wt 252 lb (114.3 kg)   SpO2 99%   BMI 35.15 kg/m   BP Readings from Last 3 Encounters:  08/22/20 138/88  08/13/20 (!) 169/111  08/08/20 122/82    Wt Readings from Last 3 Encounters:  08/22/20 252 lb (114.3 kg)  08/13/20 251 lb (113.9 kg)  08/08/20 251 lb 6 oz (114 kg)    Physical Exam Vitals reviewed.  HENT:     Head:     Comments: Coalescing over both outer ears, symmetrically is mild erythema, warmth, and swelling.  There is some scant weeping.  There is no induration, fluctuance, exudate, or tenderness.    Ears:      Nose: Nose normal.     Mouth/Throat:     Mouth: Mucous membranes are moist.  Eyes:     General: No scleral icterus.    Conjunctiva/sclera: Conjunctivae normal.  Cardiovascular:     Rate and Rhythm: Normal rate and regular rhythm.     Heart sounds: No murmur heard.   Pulmonary:     Effort: Pulmonary effort is normal.     Breath sounds: No stridor. No wheezing, rhonchi or rales.  Abdominal:  General: Abdomen is flat. Bowel sounds are normal. There is no distension.     Palpations: Abdomen is soft. There is no hepatomegaly.  Musculoskeletal:        General: Normal range of motion.     Cervical back: Neck supple.     Right lower leg: No edema.     Left lower leg: No edema.  Lymphadenopathy:     Cervical: No cervical adenopathy.  Skin:    General: Skin is warm and dry.     Findings: Erythema and rash present. No abscess. Rash is not crusting, macular, nodular, papular, purpuric, pustular, scaling, urticarial or vesicular.  Neurological:     General: No  focal deficit present.     Mental Status: He is alert.  Psychiatric:        Mood and Affect: Mood normal.        Behavior: Behavior normal.     Lab Results  Component Value Date   WBC 7.5 07/30/2020   HGB 12.3 (L) 07/30/2020   HCT 36.4 (L) 07/30/2020   PLT 322.0 07/30/2020   GLUCOSE 101 (H) 07/24/2020   CHOL 227 (H) 04/23/2020   TRIG 311.0 (H) 07/30/2020   HDL 40 04/23/2020   LDLDIRECT 135.0 04/20/2019   LDLCALC 148 (H) 04/23/2020   ALT 15 07/24/2020   AST 16 07/24/2020   NA 138 07/24/2020   K 3.9 07/24/2020   CL 101 07/24/2020   CREATININE 1.18 07/24/2020   BUN 11 07/24/2020   CO2 31 07/24/2020   TSH 1.47 04/23/2020   PSA 0.4 04/23/2020    DG ABD ACUTE 2+V W 1V CHEST  Result Date: 07/23/2020 CLINICAL DATA:  Epigastric pain for 1 week. EXAM: DG ABDOMEN ACUTE WITH 1 VIEW CHEST COMPARISON:  Chest x-ray 09/30/2007 FINDINGS: There is no evidence of dilated bowel loops or findings to suggest free intraperitoneal air. No radiopaque calculi or other significant radiographic abnormality is seen. Slight elevation of left hemidiaphragm. The heart size and mediastinal contours are within normal limits. No focal consolidation. No pulmonary edema. No pleural effusion. No pneumothorax. No acute osseous abnormality. IMPRESSION: 1. Negative abdominal radiographs. 2. No acute cardiopulmonary disease. Electronically Signed   By: Tish Frederickson M.D.   On: 07/23/2020 17:21    Assessment & Plan:   Benjamyn was seen today for rash.  Diagnoses and all orders for this visit:  Contact dermatitis, unspecified contact dermatitis type, unspecified trigger- Based on his symptoms, exam, and history I think that he is having a contact dermatitis from something in the ski mask.  He will stop using the ski mask.  Will treat him with systemic and topical steroids as well as a systemic antihistamine. -     methylPREDNISolone acetate (DEPO-MEDROL) injection 40 mg -     methylPREDNISolone acetate  (DEPO-MEDROL) injection 80 mg -     desonide (DESOWEN) 0.05 % lotion; Apply topically 2 (two) times daily. -     hydrOXYzine (ATARAX/VISTARIL) 25 MG tablet; Take 1 tablet (25 mg total) by mouth 3 (three) times daily as needed.   I am having Edgel Craton start on desonide and hydrOXYzine. I am also having him maintain his Cholecalciferol, esomeprazole, indapamide, carvedilol, and ferrous sulfate. We administered methylPREDNISolone acetate and methylPREDNISolone acetate.  Meds ordered this encounter  Medications  . methylPREDNISolone acetate (DEPO-MEDROL) injection 40 mg  . methylPREDNISolone acetate (DEPO-MEDROL) injection 80 mg  . desonide (DESOWEN) 0.05 % lotion    Sig: Apply topically 2 (two) times daily.  Dispense:  118 mL    Refill:  0  . hydrOXYzine (ATARAX/VISTARIL) 25 MG tablet    Sig: Take 1 tablet (25 mg total) by mouth 3 (three) times daily as needed.    Dispense:  30 tablet    Refill:  0     Follow-up: Return in about 2 weeks (around 09/05/2020).  Sanda Linger, MD

## 2020-08-22 NOTE — Patient Instructions (Signed)
Contact Dermatitis Dermatitis is redness, soreness, and swelling (inflammation) of the skin. Contact dermatitis is a reaction to certain substances that touch the skin. Many different substances can cause contact dermatitis. There are two types of contact dermatitis:  Irritant contact dermatitis. This type is caused by something that irritates your skin, such as having dry hands from washing them too often with soap. This type does not require previous exposure to the substance for a reaction to occur. This is the most common type.  Allergic contact dermatitis. This type is caused by a substance that you are allergic to, such as poison ivy. This type occurs when you have been exposed to the substance (allergen) and develop a sensitivity to it. Dermatitis may develop soon after your first exposure to the allergen, or it may not develop until the next time you are exposed and every time thereafter. What are the causes? Irritant contact dermatitis is most commonly caused by exposure to:  Makeup.  Soaps.  Detergents.  Bleaches.  Acids.  Metal salts, such as nickel. Allergic contact dermatitis is most commonly caused by exposure to:  Poisonous plants.  Chemicals.  Jewelry.  Latex.  Medicines.  Preservatives in products, such as clothing. What increases the risk? You are more likely to develop this condition if you have:  A job that exposes you to irritants or allergens.  Certain medical conditions, such as asthma or eczema. What are the signs or symptoms? Symptoms of this condition may occur on your body anywhere the irritant has touched you or is touched by you.  Symptoms include: ? Dryness or flaking. ? Redness. ? Cracks. ? Itching. ? Pain or a burning feeling. ? Blisters. ? Drainage of small amounts of blood or clear fluid from skin cracks. With allergic contact dermatitis, there may also be swelling in areas such as the eyelids, mouth, or genitals. How is this  diagnosed? This condition is diagnosed with a medical history and physical exam.  A patch skin test may be performed to help determine the cause.  If the condition is related to your job, you may need to see an occupational medicine specialist. How is this treated? This condition is treated by checking for the cause of the reaction and protecting your skin from further contact. Treatment may also include:  Steroid creams or ointments. Oral steroid medicines may be needed in more severe cases.  Antibiotic medicines or antibacterial ointments, if a skin infection is present.  Antihistamine lotion or an antihistamine taken by mouth to ease itching.  A bandage (dressing). Follow these instructions at home: Skin care  Moisturize your skin as needed.  Apply cool compresses to the affected areas.  Try applying baking soda paste to your skin. Stir water into baking soda until it reaches a paste-like consistency.  Do not scratch your skin, and avoid friction to the affected area.  Avoid the use of soaps, perfumes, and dyes. Medicines  Take or apply over-the-counter and prescription medicines only as told by your health care provider.  If you were prescribed an antibiotic medicine, take or apply the antibiotic as told by your health care provider. Do not stop using the antibiotic even if your condition improves. Bathing  Try taking a bath with: ? Epsom salts. Follow the instructions on the packaging. You can get these at your local pharmacy or grocery store. ? Baking soda. Pour a small amount into the bath as directed by your health care provider. ? Colloidal oatmeal. Follow the instructions on the   packaging. You can get this at your local pharmacy or grocery store.  Bathe less frequently, such as every other day.  Bathe in lukewarm water. Avoid using hot water. Bandage care  If you were given a bandage (dressing), change it as told by your health care provider.  Wash your hands  with soap and water before and after you change your dressing. If soap and water are not available, use hand sanitizer. General instructions  Avoid the substance that caused your reaction. If you do not know what caused it, keep a journal to try to track what caused it. Write down: ? What you eat. ? What cosmetic products you use. ? What you drink. ? What you wear in the affected area. This includes jewelry.  Check the affected areas every day for signs of infection. Check for: ? More redness, swelling, or pain. ? More fluid or blood. ? Warmth. ? Pus or a bad smell.  Keep all follow-up visits as told by your health care provider. This is important. Contact a health care provider if:  Your condition does not improve with treatment.  Your condition gets worse.  You have signs of infection such as swelling, tenderness, redness, soreness, or warmth in the affected area.  You have a fever.  You have new symptoms. Get help right away if:  You have a severe headache, neck pain, or neck stiffness.  You vomit.  You feel very sleepy.  You notice red streaks coming from the affected area.  Your bone or joint underneath the affected area becomes painful after the skin has healed.  The affected area turns darker.  You have difficulty breathing. Summary  Dermatitis is redness, soreness, and swelling (inflammation) of the skin. Contact dermatitis is a reaction to certain substances that touch the skin.  Symptoms of this condition may occur on your body anywhere the irritant has touched you or is touched by you.  This condition is treated by figuring out what caused the reaction and protecting your skin from further contact. Treatment may also include medicines and skin care.  Avoid the substance that caused your reaction. If you do not know what caused it, keep a journal to try to track what caused it.  Contact a health care provider if your condition gets worse or you have signs  of infection such as swelling, tenderness, redness, soreness, or warmth in the affected area. This information is not intended to replace advice given to you by your health care provider. Make sure you discuss any questions you have with your health care provider. Document Revised: 12/29/2018 Document Reviewed: 03/24/2018 Elsevier Patient Education  2020 [employer]  

## 2020-08-22 NOTE — Progress Notes (Signed)
PCP ordered 120mg of depo-medrol to be administered. 

## 2020-08-28 ENCOUNTER — Encounter: Payer: Self-pay | Admitting: Internal Medicine

## 2020-08-29 ENCOUNTER — Encounter: Payer: Self-pay | Admitting: Internal Medicine

## 2020-08-29 ENCOUNTER — Other Ambulatory Visit: Payer: Self-pay

## 2020-08-29 ENCOUNTER — Ambulatory Visit (INDEPENDENT_AMBULATORY_CARE_PROVIDER_SITE_OTHER): Payer: BC Managed Care – PPO | Admitting: Internal Medicine

## 2020-08-29 VITALS — BP 134/88 | HR 62 | Temp 98.4°F | Resp 16 | Ht 71.0 in | Wt 252.0 lb

## 2020-08-29 DIAGNOSIS — L259 Unspecified contact dermatitis, unspecified cause: Secondary | ICD-10-CM

## 2020-08-29 MED ORDER — METHYLPREDNISOLONE ACETATE 80 MG/ML IJ SUSP
80.0000 mg | Freq: Once | INTRAMUSCULAR | Status: AC
  Administered 2020-08-29: 80 mg via INTRAMUSCULAR

## 2020-08-29 NOTE — Progress Notes (Signed)
Subjective:  Patient ID: Terry [name], male    DOB: 02-01-1976  Age: 44 y.o. MRN: [id number]  CC: Rash  This visit occurred during the SARS-CoV-2 public health emergency.  Safety protocols were in place, including screening questions prior to the visit, additional usage of staff PPE, and extensive cleaning of exam room while observing appropriate contact time as indicated for disinfecting solutions.    HPI Terry [name] presents for the complaint of swelling and itching over his left outer ear.  I saw him a week ago for similar symptoms in both ears.  He was treated with an injection of methylprednisolone, antihistamines, and topical steroids.  He was doing well until 2 days ago when he used earphones only on the left ear and he now says the symptoms have returned.  Outpatient Medications Prior to Visit  Medication Sig Dispense Refill  . carvedilol (COREG) 3.125 MG tablet TAKE 1 TABLET(3.125 MG) BY MOUTH TWICE DAILY WITH A MEAL 180 tablet 1  . Cholecalciferol 2000 units TABS Take 2 tablets (4,000 Units total) by mouth daily. 180 tablet 1  . desonide (DESOWEN) 0.05 % lotion Apply topically 2 (two) times daily. 118 mL 0  . esomeprazole (NEXIUM) 40 MG capsule Take 1 capsule (40 mg total) by mouth daily. 90 capsule 1  . ferrous sulfate 220 (44 Fe) MG/5ML solution Take 5 mLs (220 mg total) by mouth 3 (three) times daily with meals. 473 mL 0  . hydrOXYzine (ATARAX/VISTARIL) 25 MG tablet Take 1 tablet (25 mg total) by mouth 3 (three) times daily as needed. 30 tablet 0  . indapamide (LOZOL) 1.25 MG tablet TAKE 1 TABLET(1.25 MG) BY MOUTH DAILY 90 tablet 1   No facility-administered medications prior to visit.    ROS Review of Systems  Constitutional: Negative for chills, fatigue and fever.  HENT: Negative.  Negative for sore throat and trouble swallowing.   Eyes: Negative for visual disturbance.  Respiratory: Negative for cough, chest tightness, shortness of breath and wheezing.    Cardiovascular: Negative for chest pain, palpitations and leg swelling.  Gastrointestinal: Negative for abdominal pain, constipation, diarrhea, nausea and vomiting.  Endocrine: Negative.   Genitourinary: Negative.  Negative for difficulty urinating and dysuria.  Musculoskeletal: Negative.   Skin: Positive for rash.  Neurological: Negative.  Negative for dizziness, weakness, light-headedness, numbness and headaches.  Hematological: Negative for adenopathy. Does not bruise/bleed easily.  Psychiatric/Behavioral: Negative.     Objective:  BP 134/88   Pulse 62   Temp 98.4 F (36.9 C) (Oral)   Resp 16   Ht 5\' 11"  (1.803 m)   Wt 252 lb (114.3 kg)   SpO2 97%   BMI 35.15 kg/m   BP Readings from Last 3 Encounters:  08/29/20 134/88  08/22/20 138/88  08/13/20 (!) 169/111    Wt Readings from Last 3 Encounters:  08/29/20 252 lb (114.3 kg)  08/22/20 252 lb (114.3 kg)  08/13/20 251 lb (113.9 kg)    Physical Exam Vitals reviewed.  Constitutional:      Appearance: Normal appearance.  HENT:     Nose: Nose normal.     Mouth/Throat:     Mouth: Mucous membranes are moist.  Eyes:     General: No scleral icterus.    Conjunctiva/sclera: Conjunctivae normal.  Cardiovascular:     Rate and Rhythm: Normal rate and regular rhythm.     Heart sounds: No murmur heard.   Pulmonary:     Effort: Pulmonary effort is normal.     Breath  sounds: No stridor. No wheezing, rhonchi or rales.  Abdominal:     General: Abdomen is flat.     Palpations: There is no mass.     Tenderness: There is no abdominal tenderness. There is no guarding.     Hernia: No hernia is present.  Musculoskeletal:        General: Normal range of motion.     Cervical back: Neck supple.     Right lower leg: No edema.     Left lower leg: No edema.  Lymphadenopathy:     Cervical: No cervical adenopathy.  Skin:    General: Skin is warm and dry.     Findings: Erythema and rash present. No ecchymosis. Rash is macular. Rash  is not crusting, papular, pustular, urticarial or vesicular.     Comments: There is mild erythema and swelling over the outer ear.  There are no vesicles, pustules, exudates, induration, or fluctuance.  Similar findings are noted on the scalp encircling the ear.  Neurological:     General: No focal deficit present.     Mental Status: He is alert.  Psychiatric:        Mood and Affect: Mood normal.        Behavior: Behavior normal.     Lab Results  Component Value Date   WBC 7.5 07/30/2020   HGB 12.3 (L) 07/30/2020   HCT 36.4 (L) 07/30/2020   PLT 322.0 07/30/2020   GLUCOSE 101 (H) 07/24/2020   CHOL 227 (H) 04/23/2020   TRIG 311.0 (H) 07/30/2020   HDL 40 04/23/2020   LDLDIRECT 135.0 04/20/2019   LDLCALC 148 (H) 04/23/2020   ALT 15 07/24/2020   AST 16 07/24/2020   NA 138 07/24/2020   K 3.9 07/24/2020   CL 101 07/24/2020   CREATININE 1.18 07/24/2020   BUN 11 07/24/2020   CO2 31 07/24/2020   TSH 1.47 04/23/2020   PSA 0.4 04/23/2020    DG ABD ACUTE 2+V W 1V CHEST  Result Date: 07/23/2020 CLINICAL DATA:  Epigastric pain for 1 week. EXAM: DG ABDOMEN ACUTE WITH 1 VIEW CHEST COMPARISON:  Chest x-ray 09/30/2007 FINDINGS: There is no evidence of dilated bowel loops or findings to suggest free intraperitoneal air. No radiopaque calculi or other significant radiographic abnormality is seen. Slight elevation of left hemidiaphragm. The heart size and mediastinal contours are within normal limits. No focal consolidation. No pulmonary edema. No pleural effusion. No pneumothorax. No acute osseous abnormality. IMPRESSION: 1. Negative abdominal radiographs. 2. No acute cardiopulmonary disease. Electronically Signed   By: Tish Frederickson M.D.   On: 07/23/2020 17:21    Assessment & Plan:   Terry [name] was seen today for rash.  Diagnoses and all orders for this visit:  Contact dermatitis, unspecified contact dermatitis type, unspecified trigger- He agrees to stop using the headphones.  Will continue  the topical steroids and oral antihistamines.  We will treat with another course of methylprednisolone. -     methylPREDNISolone acetate (DEPO-MEDROL) injection 80 mg   I am having Terry [name] maintain his Cholecalciferol, esomeprazole, indapamide, carvedilol, ferrous sulfate, desonide, and hydrOXYzine. We administered methylPREDNISolone acetate.  Meds ordered this encounter  Medications  . methylPREDNISolone acetate (DEPO-MEDROL) injection 80 mg     Follow-up: No follow-ups on file.  Sanda Linger, MD

## 2020-08-30 ENCOUNTER — Other Ambulatory Visit: Payer: Self-pay

## 2020-08-30 ENCOUNTER — Telehealth: Payer: Self-pay

## 2020-08-30 MED ORDER — TALICIA 250-12.5-10 MG PO CPDR: DELAYED_RELEASE_CAPSULE | ORAL | 0 refills | Status: AC

## 2020-08-30 NOTE — Telephone Encounter (Signed)
Torrie Mayers to [facility] and faxed documentation

## 2020-09-01 ENCOUNTER — Encounter: Payer: Self-pay | Admitting: Internal Medicine

## 2020-09-01 NOTE — Patient Instructions (Signed)
Contact Dermatitis Dermatitis is redness, soreness, and swelling (inflammation) of the skin. Contact dermatitis is a reaction to certain substances that touch the skin. Many different substances can cause contact dermatitis. There are two types of contact dermatitis:  Irritant contact dermatitis. This type is caused by something that irritates your skin, such as having dry hands from washing them too often with soap. This type does not require previous exposure to the substance for a reaction to occur. This is the most common type.  Allergic contact dermatitis. This type is caused by a substance that you are allergic to, such as poison ivy. This type occurs when you have been exposed to the substance (allergen) and develop a sensitivity to it. Dermatitis may develop soon after your first exposure to the allergen, or it may not develop until the next time you are exposed and every time thereafter. What are the causes? Irritant contact dermatitis is most commonly caused by exposure to:  Makeup.  Soaps.  Detergents.  Bleaches.  Acids.  Metal salts, such as nickel. Allergic contact dermatitis is most commonly caused by exposure to:  Poisonous plants.  Chemicals.  Jewelry.  Latex.  Medicines.  Preservatives in products, such as clothing. What increases the risk? You are more likely to develop this condition if you have:  A job that exposes you to irritants or allergens.  Certain medical conditions, such as asthma or eczema. What are the signs or symptoms? Symptoms of this condition may occur on your body anywhere the irritant has touched you or is touched by you.  Symptoms include: ? Dryness or flaking. ? Redness. ? Cracks. ? Itching. ? Pain or a burning feeling. ? Blisters. ? Drainage of small amounts of blood or clear fluid from skin cracks. With allergic contact dermatitis, there may also be swelling in areas such as the eyelids, mouth, or genitals. How is this  diagnosed? This condition is diagnosed with a medical history and physical exam.  A patch skin test may be performed to help determine the cause.  If the condition is related to your job, you may need to see an occupational medicine specialist. How is this treated? This condition is treated by checking for the cause of the reaction and protecting your skin from further contact. Treatment may also include:  Steroid creams or ointments. Oral steroid medicines may be needed in more severe cases.  Antibiotic medicines or antibacterial ointments, if a skin infection is present.  Antihistamine lotion or an antihistamine taken by mouth to ease itching.  A bandage (dressing). Follow these instructions at home: Skin care  Moisturize your skin as needed.  Apply cool compresses to the affected areas.  Try applying baking soda paste to your skin. Stir water into baking soda until it reaches a paste-like consistency.  Do not scratch your skin, and avoid friction to the affected area.  Avoid the use of soaps, perfumes, and dyes. Medicines  Take or apply over-the-counter and prescription medicines only as told by your health care provider.  If you were prescribed an antibiotic medicine, take or apply the antibiotic as told by your health care provider. Do not stop using the antibiotic even if your condition improves. Bathing  Try taking a bath with: ? Epsom salts. Follow the instructions on the packaging. You can get these at your local pharmacy or grocery store. ? Baking soda. Pour a small amount into the bath as directed by your health care provider. ? Colloidal oatmeal. Follow the instructions on the   packaging. You can get this at your local pharmacy or grocery store.  Bathe less frequently, such as every other day.  Bathe in lukewarm water. Avoid using hot water. Bandage care  If you were given a bandage (dressing), change it as told by your health care provider.  Wash your hands  with soap and water before and after you change your dressing. If soap and water are not available, use hand sanitizer. General instructions  Avoid the substance that caused your reaction. If you do not know what caused it, keep a journal to try to track what caused it. Write down: ? What you eat. ? What cosmetic products you use. ? What you drink. ? What you wear in the affected area. This includes jewelry.  Check the affected areas every day for signs of infection. Check for: ? More redness, swelling, or pain. ? More fluid or blood. ? Warmth. ? Pus or a bad smell.  Keep all follow-up visits as told by your health care provider. This is important. Contact a health care provider if:  Your condition does not improve with treatment.  Your condition gets worse.  You have signs of infection such as swelling, tenderness, redness, soreness, or warmth in the affected area.  You have a fever.  You have new symptoms. Get help right away if:  You have a severe headache, neck pain, or neck stiffness.  You vomit.  You feel very sleepy.  You notice red streaks coming from the affected area.  Your bone or joint underneath the affected area becomes painful after the skin has healed.  The affected area turns darker.  You have difficulty breathing. Summary  Dermatitis is redness, soreness, and swelling (inflammation) of the skin. Contact dermatitis is a reaction to certain substances that touch the skin.  Symptoms of this condition may occur on your body anywhere the irritant has touched you or is touched by you.  This condition is treated by figuring out what caused the reaction and protecting your skin from further contact. Treatment may also include medicines and skin care.  Avoid the substance that caused your reaction. If you do not know what caused it, keep a journal to try to track what caused it.  Contact a health care provider if your condition gets worse or you have signs  of infection such as swelling, tenderness, redness, soreness, or warmth in the affected area. This information is not intended to replace advice given to you by your health care provider. Make sure you discuss any questions you have with your health care provider. Document Revised: 12/29/2018 Document Reviewed: 03/24/2018 Elsevier Patient Education  2020 [employer]  

## 2020-09-19 DIAGNOSIS — Z03818 Encounter for observation for suspected exposure to other biological agents ruled out: Secondary | ICD-10-CM | POA: Diagnosis not present

## 2020-09-27 ENCOUNTER — Telehealth: Payer: Self-pay | Admitting: Gastroenterology

## 2020-09-27 NOTE — Telephone Encounter (Signed)
Arlys John with [employer] my [facility] called inquiring if Phoebe Perch was still needed for pt. Pls call Cover my meds at [phone]. Pt's key # is [id number].

## 2020-09-27 NOTE — Telephone Encounter (Signed)
Patient has already picked up samples of Talicia. [employer] my [facility] notified.

## 2020-10-09 ENCOUNTER — Other Ambulatory Visit: Payer: BC Managed Care – PPO

## 2020-10-29 ENCOUNTER — Ambulatory Visit (INDEPENDENT_AMBULATORY_CARE_PROVIDER_SITE_OTHER): Payer: BC Managed Care – PPO | Admitting: Internal Medicine

## 2020-10-29 ENCOUNTER — Encounter: Payer: Self-pay | Admitting: Internal Medicine

## 2020-10-29 ENCOUNTER — Other Ambulatory Visit: Payer: Self-pay

## 2020-10-29 VITALS — BP 164/116 | HR 60 | Temp 98.1°F | Ht 71.0 in | Wt 247.0 lb

## 2020-10-29 DIAGNOSIS — I10 Essential (primary) hypertension: Secondary | ICD-10-CM

## 2020-10-29 DIAGNOSIS — D508 Other iron deficiency anemias: Secondary | ICD-10-CM

## 2020-10-29 LAB — CBC WITH DIFFERENTIAL/PLATELET
Basophils Absolute: 0 10*3/uL (ref 0.0–0.1)
Basophils Relative: 0.3 % (ref 0.0–3.0)
Eosinophils Absolute: 0.1 10*3/uL (ref 0.0–0.7)
Eosinophils Relative: 1.6 % (ref 0.0–5.0)
HCT: 42.4 % (ref 39.0–52.0)
Hemoglobin: 14.2 g/dL (ref 13.0–17.0)
Lymphocytes Relative: 37.8 % (ref 12.0–46.0)
Lymphs Abs: 2.9 10*3/uL (ref 0.7–4.0)
MCHC: 33.5 g/dL (ref 30.0–36.0)
MCV: 83.6 fl (ref 78.0–100.0)
Monocytes Absolute: 0.5 10*3/uL (ref 0.1–1.0)
Monocytes Relative: 6.6 % (ref 3.0–12.0)
Neutro Abs: 4.1 10*3/uL (ref 1.4–7.7)
Neutrophils Relative %: 53.7 % (ref 43.0–77.0)
Platelets: 222 10*3/uL (ref 150.0–400.0)
RBC: 5.07 Mil/uL (ref 4.22–5.81)
RDW: 14.8 % (ref 11.5–15.5)
WBC: 7.6 10*3/uL (ref 4.0–10.5)

## 2020-10-29 LAB — BASIC METABOLIC PANEL
BUN: 14 mg/dL (ref 6–23)
CO2: 30 mEq/L (ref 19–32)
Calcium: 9.4 mg/dL (ref 8.4–10.5)
Chloride: 103 mEq/L (ref 96–112)
Creatinine, Ser: 1.28 mg/dL (ref 0.40–1.50)
GFR: 68.88 mL/min (ref >60.00)
Glucose, Bld: 88 mg/dL (ref 70–99)
Potassium: 4.2 mEq/L (ref 3.5–5.1)
Sodium: 138 mEq/L (ref 135–145)

## 2020-10-29 LAB — IRON: Iron: 61 ug/dL (ref 42–165)

## 2020-10-29 LAB — FERRITIN: Ferritin: 18 ng/mL — ABNORMAL LOW (ref 22.0–322.0)

## 2020-10-29 MED ORDER — TRIAMTERENE-HCTZ 37.5-25 MG PO CAPS: 1.0000 | ORAL_CAPSULE | Freq: Every day | ORAL | 0 refills | Status: DC

## 2020-10-29 MED ORDER — CARVEDILOL 3.125 MG PO TABS: ORAL_TABLET | ORAL | 1 refills | Status: AC

## 2020-10-29 NOTE — Patient Instructions (Signed)

## 2020-10-29 NOTE — Progress Notes (Signed)
Subjective:  Patient ID: Terry [name], male    DOB: Jan 28, 1976  Age: 45 y.o. MRN: [id number]  CC: Hypertension  This visit occurred during the SARS-CoV-2 public health emergency.  Safety protocols were in place, including screening questions prior to the visit, additional usage of staff PPE, and extensive cleaning of exam room while observing appropriate contact time as indicated for disinfecting solutions.    HPI Terry [name] presents for f/up -   He tells me he is taking carvedilol but not indapamide.  He has not been working on his lifestyle modifications.  He does not monitor his blood pressure.  He denies headache, blurred vision, chest pain, shortness of breath, edema, or fatigue.  Outpatient Medications Prior to Visit  Medication Sig Dispense Refill  . Amoxicill-Rifabutin-Omeprazole (TALICIA) 250-12.5-10 MG CPDR Take 4 tablets po TID for 14 days 168 capsule 0  . Cholecalciferol 2000 units TABS Take 2 tablets (4,000 Units total) by mouth daily. 180 tablet 1  . desonide (DESOWEN) 0.05 % lotion Apply topically 2 (two) times daily. 118 mL 0  . esomeprazole (NEXIUM) 40 MG capsule Take 1 capsule (40 mg total) by mouth daily. 90 capsule 1  . ferrous sulfate 220 (44 Fe) MG/5ML solution Take 5 mLs (220 mg total) by mouth 3 (three) times daily with meals. 473 mL 0  . hydrOXYzine (ATARAX/VISTARIL) 25 MG tablet Take 1 tablet (25 mg total) by mouth 3 (three) times daily as needed. 30 tablet 0  . carvedilol (COREG) 3.125 MG tablet TAKE 1 TABLET(3.125 MG) BY MOUTH TWICE DAILY WITH A MEAL 180 tablet 1  . indapamide (LOZOL) 1.25 MG tablet TAKE 1 TABLET(1.25 MG) BY MOUTH DAILY 90 tablet 1   No facility-administered medications prior to visit.    ROS Review of Systems  Constitutional: Negative.  Negative for appetite change, diaphoresis, fatigue and unexpected weight change.  HENT: Negative.   Eyes: Negative for visual disturbance.  Respiratory: Negative for cough, chest tightness  and shortness of breath.   Cardiovascular: Negative for chest pain, palpitations and leg swelling.  Gastrointestinal: Negative for abdominal pain, constipation, diarrhea, nausea and vomiting.  Endocrine: Negative.   Genitourinary: Negative.  Negative for difficulty urinating and hematuria.  Musculoskeletal: Negative for arthralgias, back pain and myalgias.  Skin: Negative.  Negative for color change and pallor.  Neurological: Negative.  Negative for dizziness, weakness, light-headedness and headaches.  Hematological: Negative for adenopathy. Does not bruise/bleed easily.  Psychiatric/Behavioral: Negative.     Objective:  BP (!) 164/116   Pulse 60   Temp 98.1 F (36.7 C) (Oral)   Ht 5\' 11"  (1.803 m)   Wt 247 lb (112 kg)   SpO2 96%   BMI 34.45 kg/m   BP Readings from Last 3 Encounters:  10/29/20 (!) 164/116  08/29/20 134/88  08/22/20 138/88    Wt Readings from Last 3 Encounters:  10/29/20 247 lb (112 kg)  08/29/20 252 lb (114.3 kg)  08/22/20 252 lb (114.3 kg)    Physical Exam Vitals reviewed.  HENT:     Nose: Nose normal.     Mouth/Throat:     Mouth: Mucous membranes are moist.  Eyes:     General: No scleral icterus.    Conjunctiva/sclera: Conjunctivae normal.  Cardiovascular:     Rate and Rhythm: Normal rate and regular rhythm.     Heart sounds: No murmur heard.   Pulmonary:     Effort: Pulmonary effort is normal.     Breath sounds: No stridor. No wheezing, rhonchi  or rales.  Abdominal:     General: Abdomen is flat.     Palpations: There is no mass.     Tenderness: There is no abdominal tenderness. There is no guarding.  Musculoskeletal:        General: No tenderness or deformity. Normal range of motion.     Cervical back: Neck supple.  Lymphadenopathy:     Cervical: No cervical adenopathy.  Skin:    General: Skin is warm and dry.  Neurological:     General: No focal deficit present.     Mental Status: He is alert.     Lab Results  Component Value  Date   WBC 7.6 10/29/2020   HGB 14.2 10/29/2020   HCT 42.4 10/29/2020   PLT 222.0 10/29/2020   GLUCOSE 88 10/29/2020   CHOL 227 (H) 04/23/2020   TRIG 311.0 (H) 07/30/2020   HDL 40 04/23/2020   LDLDIRECT 135.0 04/20/2019   LDLCALC 148 (H) 04/23/2020   ALT 15 07/24/2020   AST 16 07/24/2020   NA 138 10/29/2020   K 4.2 10/29/2020   CL 103 10/29/2020   CREATININE 1.28 10/29/2020   BUN 14 10/29/2020   CO2 30 10/29/2020   TSH 1.47 04/23/2020   PSA 0.4 04/23/2020    DG ABD ACUTE 2+V W 1V CHEST  Result Date: 07/23/2020 CLINICAL DATA:  Epigastric pain for 1 week. EXAM: DG ABDOMEN ACUTE WITH 1 VIEW CHEST COMPARISON:  Chest x-ray 09/30/2007 FINDINGS: There is no evidence of dilated bowel loops or findings to suggest free intraperitoneal air. No radiopaque calculi or other significant radiographic abnormality is seen. Slight elevation of left hemidiaphragm. The heart size and mediastinal contours are within normal limits. No focal consolidation. No pulmonary edema. No pleural effusion. No pneumothorax. No acute osseous abnormality. IMPRESSION: 1. Negative abdominal radiographs. 2. No acute cardiopulmonary disease. Electronically Signed   By: Tish Frederickson M.D.   On: 07/23/2020 17:21    Assessment & Plan:   Pink was seen today for hypertension.  Diagnoses and all orders for this visit:  Iron deficiency anemia secondary to inadequate dietary iron intake- His H&H are normal now. -     CBC with Differential/Platelet; Future -     Iron; Future -     Ferritin; Future -     Ferritin -     Iron -     CBC with Differential/Platelet  Essential hypertension- His blood pressure is not adequately well controlled.  His heart rate is 60 so will continue the current dose of carvedilol.  I recommended that he start triamterene and hydrochlorothiazide. -     Basic metabolic panel; Future -     triamterene-hydrochlorothiazide (DYAZIDE) 37.5-25 MG capsule; Take 1 each (1 capsule total) by mouth  daily. -     carvedilol (COREG) 3.125 MG tablet; TAKE 1 TABLET(3.125 MG) BY MOUTH TWICE DAILY WITH A MEAL -     Basic metabolic panel   I have discontinued Muaad Amsler's indapamide. I am also having him start on triamterene-hydrochlorothiazide. Additionally, I am having him maintain his Cholecalciferol, esomeprazole, ferrous sulfate, desonide, hydrOXYzine, Talicia, and carvedilol.  Meds ordered this encounter  Medications  . triamterene-hydrochlorothiazide (DYAZIDE) 37.5-25 MG capsule    Sig: Take 1 each (1 capsule total) by mouth daily.    Dispense:  90 capsule    Refill:  0  . carvedilol (COREG) 3.125 MG tablet    Sig: TAKE 1 TABLET(3.125 MG) BY MOUTH TWICE DAILY WITH A MEAL  Dispense:  180 tablet    Refill:  1     Follow-up: Return in about 6 weeks (around 12/10/2020).  Sanda Linger, MD

## 2021-02-24 ENCOUNTER — Other Ambulatory Visit: Payer: Self-pay | Admitting: Internal Medicine

## 2021-02-24 DIAGNOSIS — I10 Essential (primary) hypertension: Secondary | ICD-10-CM

## 2021-07-17 ENCOUNTER — Other Ambulatory Visit: Payer: Self-pay | Admitting: Internal Medicine

## 2021-07-17 DIAGNOSIS — I10 Essential (primary) hypertension: Secondary | ICD-10-CM

## 2021-08-21 DIAGNOSIS — M25571 Pain in right ankle and joints of right foot: Secondary | ICD-10-CM | POA: Diagnosis not present

## 2021-08-21 DIAGNOSIS — X500XXA Overexertion from strenuous movement or load, initial encounter: Secondary | ICD-10-CM | POA: Diagnosis not present

## 2021-08-21 DIAGNOSIS — S93401A Sprain of unspecified ligament of right ankle, initial encounter: Secondary | ICD-10-CM | POA: Diagnosis not present

## 2022-08-08 IMAGING — CR DG ABDOMEN ACUTE W/ 1V CHEST
4 series · 4 of 4 positions shown · non-contrast
Comparison: Chest x-ray 09/30/2007

CLINICAL DATA: Epigastric pain for 1 week.

EXAM:
DG ABDOMEN ACUTE WITH 1 VIEW CHEST

[w chest pa]
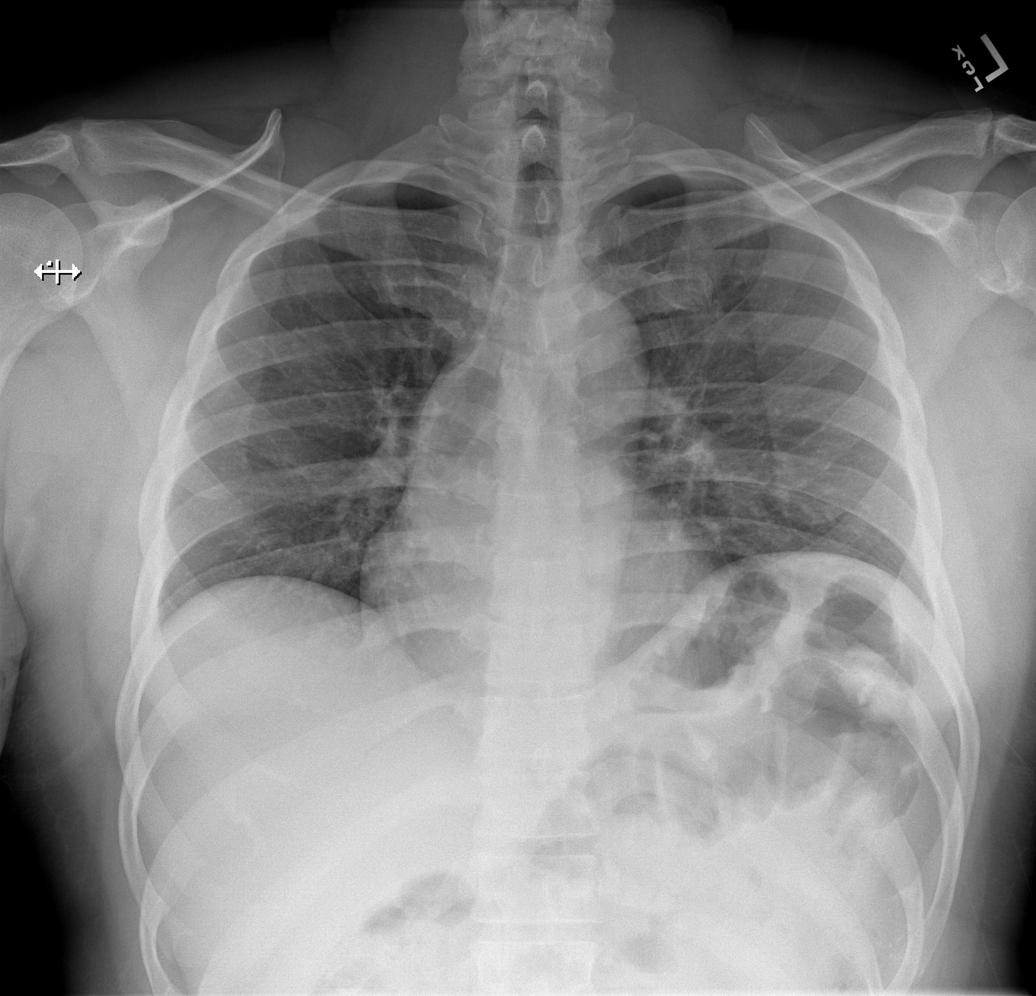

[w abdomen upright]
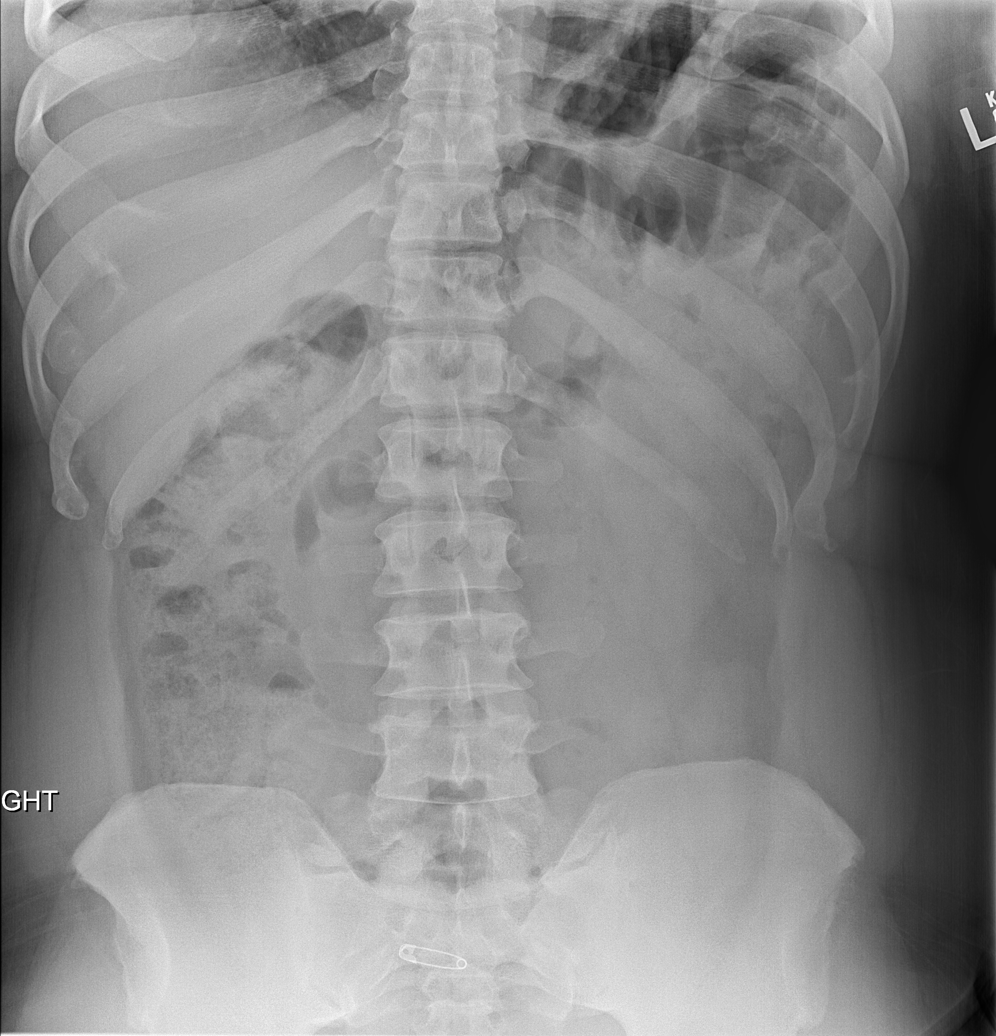

[t abdomen supine (1 of 2)]
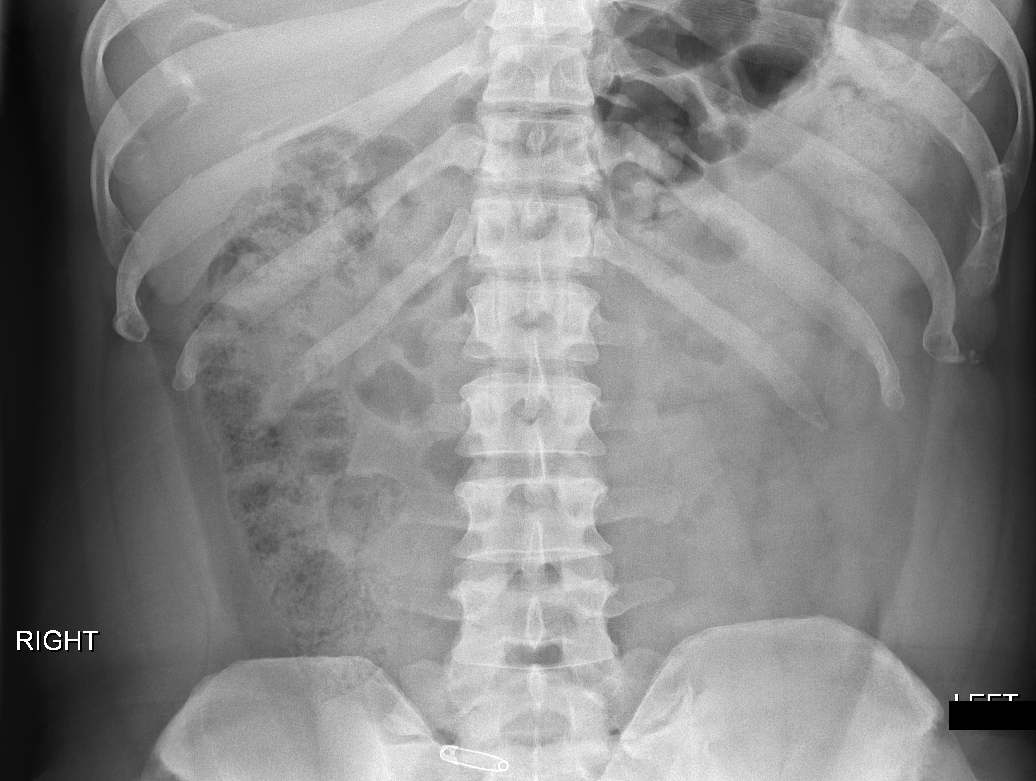

[t abdomen supine (2 of 2)]
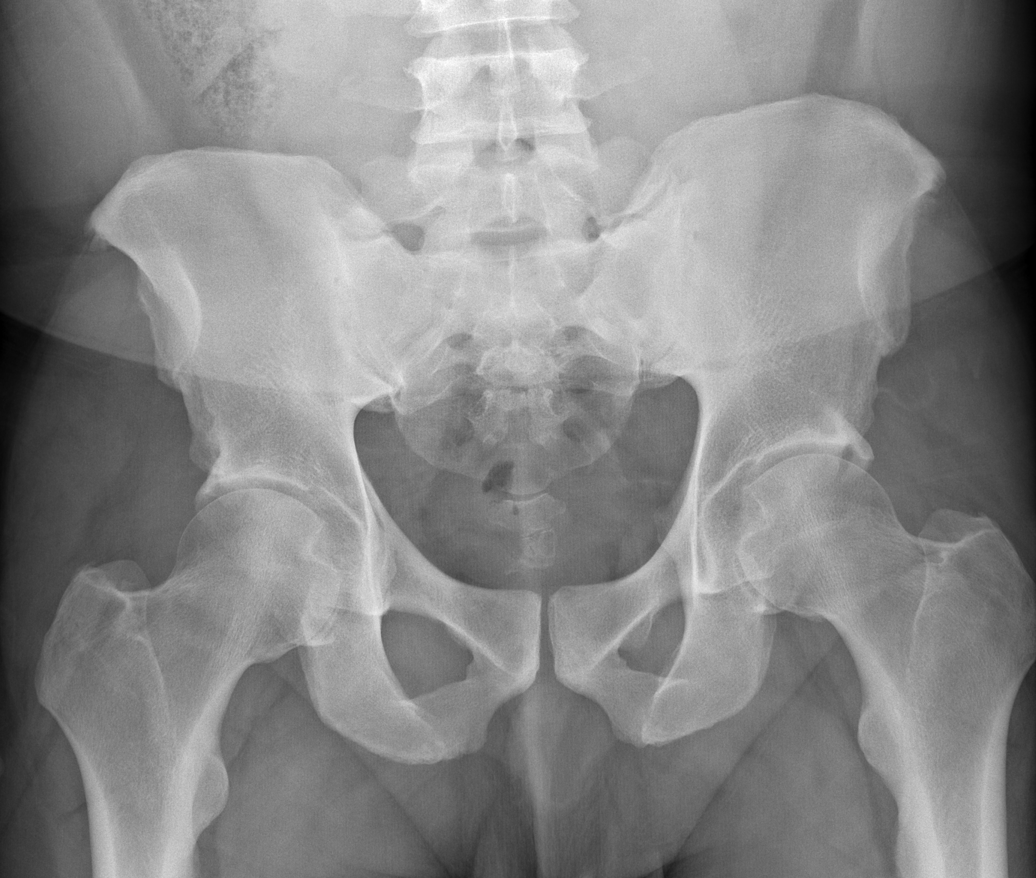

[4 of 4 positions shown; findings below may reference images not displayed]

FINDINGS: There is no evidence of dilated bowel loops or findings to suggest
free intraperitoneal air. No radiopaque calculi or other significant
radiographic abnormality is seen.

Slight elevation of left hemidiaphragm. The heart size and
mediastinal contours are within normal limits.

No focal consolidation. No pulmonary edema. No pleural effusion. No
pneumothorax.

No acute osseous abnormality.
IMPRESSION: 1. Negative abdominal radiographs.
2. No acute cardiopulmonary disease.
